# Patient Record
Sex: Male | Born: 1940 | Race: White | Hispanic: No | Marital: Married | Smoking: Former smoker
Health system: Southern US, Community
[De-identification: ages and names within clinical notes are randomized; demographics above are authoritative.]

## PROBLEM LIST (undated history)

## (undated) DIAGNOSIS — K4090 Unilateral inguinal hernia, without obstruction or gangrene, not specified as recurrent: Secondary | ICD-10-CM

## (undated) DIAGNOSIS — E785 Hyperlipidemia, unspecified: Secondary | ICD-10-CM

## (undated) DIAGNOSIS — G4730 Sleep apnea, unspecified: Secondary | ICD-10-CM

## (undated) DIAGNOSIS — Z87442 Personal history of urinary calculi: Secondary | ICD-10-CM

## (undated) DIAGNOSIS — I10 Essential (primary) hypertension: Secondary | ICD-10-CM

## (undated) DIAGNOSIS — I499 Cardiac arrhythmia, unspecified: Secondary | ICD-10-CM

## (undated) DIAGNOSIS — M549 Dorsalgia, unspecified: Secondary | ICD-10-CM

## (undated) DIAGNOSIS — I4891 Unspecified atrial fibrillation: Secondary | ICD-10-CM

## (undated) DIAGNOSIS — K219 Gastro-esophageal reflux disease without esophagitis: Secondary | ICD-10-CM

## (undated) DIAGNOSIS — K649 Unspecified hemorrhoids: Secondary | ICD-10-CM

## (undated) DIAGNOSIS — Z8719 Personal history of other diseases of the digestive system: Secondary | ICD-10-CM

## (undated) DIAGNOSIS — C439 Malignant melanoma of skin, unspecified: Secondary | ICD-10-CM

## (undated) DIAGNOSIS — R109 Unspecified abdominal pain: Secondary | ICD-10-CM

## (undated) DIAGNOSIS — Z8739 Personal history of other diseases of the musculoskeletal system and connective tissue: Secondary | ICD-10-CM

## (undated) HISTORY — DX: Sleep apnea, unspecified: G47.30

## (undated) HISTORY — PX: DIAGNOSTIC LAPAROSCOPY: SUR761

## (undated) HISTORY — PX: COLONOSCOPY: SHX174

## (undated) HISTORY — DX: Dorsalgia, unspecified: M54.9

## (undated) HISTORY — DX: Hyperlipidemia, unspecified: E78.5

## (undated) HISTORY — DX: Unilateral inguinal hernia, without obstruction or gangrene, not specified as recurrent: K40.90

## (undated) HISTORY — DX: Unspecified abdominal pain: R10.9

## (undated) HISTORY — DX: Unspecified hemorrhoids: K64.9

## (undated) HISTORY — DX: Essential (primary) hypertension: I10

## (undated) HISTORY — DX: Gastro-esophageal reflux disease without esophagitis: K21.9

## (undated) HISTORY — DX: Malignant melanoma of skin, unspecified: C43.9

## (undated) HISTORY — PX: HERNIA REPAIR: SHX51

---

## 2000-10-24 ENCOUNTER — Other Ambulatory Visit: Admission: RE | Admit: 2000-10-24 | Discharge: 2000-10-24 | Payer: Self-pay | Admitting: Urology

## 2000-10-31 ENCOUNTER — Encounter: Payer: Self-pay | Admitting: Urology

## 2000-10-31 ENCOUNTER — Ambulatory Visit (HOSPITAL_COMMUNITY): Admission: RE | Admit: 2000-10-31 | Discharge: 2000-10-31 | Payer: Self-pay | Admitting: Urology

## 2003-08-26 ENCOUNTER — Encounter: Admission: RE | Admit: 2003-08-26 | Discharge: 2003-08-26 | Payer: Self-pay | Admitting: Internal Medicine

## 2003-10-19 ENCOUNTER — Ambulatory Visit (HOSPITAL_COMMUNITY): Admission: RE | Admit: 2003-10-19 | Discharge: 2003-10-19 | Payer: Self-pay | Admitting: *Deleted

## 2003-10-19 ENCOUNTER — Encounter (INDEPENDENT_AMBULATORY_CARE_PROVIDER_SITE_OTHER): Payer: Self-pay | Admitting: *Deleted

## 2007-07-07 ENCOUNTER — Encounter: Admission: RE | Admit: 2007-07-07 | Discharge: 2007-07-07 | Payer: Self-pay | Admitting: Internal Medicine

## 2009-11-11 ENCOUNTER — Emergency Department (HOSPITAL_COMMUNITY): Admission: EM | Admit: 2009-11-11 | Discharge: 2009-11-11 | Payer: Self-pay | Admitting: Emergency Medicine

## 2010-08-21 LAB — CBC
HCT: 40.5 % (ref 39.0–52.0)
Hemoglobin: 14 g/dL (ref 13.0–17.0)
MCV: 90.2 fL (ref 78.0–100.0)
RBC: 4.49 MIL/uL (ref 4.22–5.81)
WBC: 26.9 10*3/uL — ABNORMAL HIGH (ref 4.0–10.5)

## 2010-08-21 LAB — DIFFERENTIAL
Basophils Relative: 0 % (ref 0–1)
Eosinophils Relative: 0 % (ref 0–5)
Lymphs Abs: 0.5 10*3/uL — ABNORMAL LOW (ref 0.7–4.0)
Monocytes Relative: 3 % (ref 3–12)
Neutro Abs: 25.6 10*3/uL — ABNORMAL HIGH (ref 1.7–7.7)

## 2010-08-21 LAB — URINALYSIS, ROUTINE W REFLEX MICROSCOPIC
Bilirubin Urine: NEGATIVE
Glucose, UA: NEGATIVE mg/dL
Nitrite: NEGATIVE
Protein, ur: 30 mg/dL — AB
Specific Gravity, Urine: 1.015 (ref 1.005–1.030)
Urobilinogen, UA: 0.2 mg/dL (ref 0.0–1.0)
pH: 5.5 (ref 5.0–8.0)

## 2010-08-21 LAB — URINE MICROSCOPIC-ADD ON

## 2010-08-21 LAB — URINE CULTURE: Colony Count: >=100000

## 2010-08-21 LAB — POCT I-STAT, CHEM 8
BUN: 23 mg/dL (ref 6–23)
Calcium, Ion: 1.16 mmol/L (ref 1.12–1.32)
Chloride: 103 mEq/L (ref 96–112)
Creatinine, Ser: 1.6 mg/dL — ABNORMAL HIGH (ref 0.4–1.5)
TCO2: 27 mmol/L (ref 0–100)

## 2010-10-20 NOTE — Op Note (Signed)
NAME:  Allen Pierce, Allen Pierce                          ACCOUNT NO.:  [id number]   MEDICAL RECORD NO.:  [id number]                   PATIENT TYPE:  AMB   LOCATION:  ENDO                                 FACILITY:  [facility]   PHYSICIAN:  Georgiana Spinner, M.D.                 DATE OF BIRTH:  Jan 15, 1941   DATE OF PROCEDURE:  10/19/2003  DATE OF DISCHARGE:                                 OPERATIVE REPORT   PROCEDURE:  Colonoscopy.   INDICATIONS FOR PROCEDURE:  Rectal bleeding.   ANESTHESIA:  Demerol 20.   PROCEDURE:  With the patient mildly sedated in the left lateral decubitus  position, a rectal exam was performed which revealed only hemorrhoids.  Subsequently, the Olympus videoscopic colonoscope was inserted in the rectum  and passed under direct vision to the cecum identified by the ileocecal  valve and appendiceal orifice, both of which were photographed.  From this  point, the colonoscope was slowly withdrawn taking circumferential views of  the colonic mucosa stopping only in the rectum which appeared normal on  direct and showed hemorrhoids on retroflex view.  The endoscope was  straightened and withdrawn.  The patient's vital signs and pulse oximeter  remained stable.  The patient tolerated the procedure well without apparent  complications.   FINDINGS:  Internal hemorrhoids, otherwise, unremarkable exam.   PLAN:  See endoscopy note for further details.                                               Georgiana Spinner, M.D.    GMO/MEDQ  D:  10/19/2003  T:  10/19/2003  Job:  [id number]

## 2010-10-20 NOTE — Op Note (Signed)
NAME:  NARAYAN, SCULL                          ACCOUNT NO.:  [id number]   MEDICAL RECORD NO.:  [id number]                   PATIENT TYPE:  AMB   LOCATION:  ENDO                                 FACILITY:  [facility]   PHYSICIAN:  Georgiana Spinner, M.D.                 DATE OF BIRTH:  1940/06/18   DATE OF PROCEDURE:  10/19/2003  DATE OF DISCHARGE:                                 OPERATIVE REPORT   PROCEDURE:  Upper endoscopy.   INDICATIONS FOR PROCEDURE:  GERD.   ANESTHESIA:  Demerol 80, Versed 10 mg.   PROCEDURE:  With the patient mildly sedated in the left lateral decubitus  position, the Olympus videoscopic endoscope was inserted in the mouth and  passed under direct vision through the esophagus which appeared normal  except for a questionable area of Barrett's esophagus which was photographed  and biopsied.  We entered into the stomach.  The fundus, body, antrum,  duodenal bulb, and second portion of the duodenum all appeared normal.  From  this point, the endoscope was slowly withdrawn taking circumferential views  of the duodenal mucosa until the endoscope was pulled back into the stomach  and placed in retroflexion to view the stomach from below.  The endoscope  was then straightened and withdrawn taking circumferential views of the  remaining gastric and esophageal mucosa.  The patient's vital signs and  pulse oximeter remained stable.  The patient tolerated the procedure well  without apparent complications.   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   PLAN:  Await biopsy report.  The patient will call me for the results and  follow up with me as an outpatient.  Proceed to colonoscopy.                                               Georgiana Spinner, M.D.    GMO/MEDQ  D:  10/19/2003  T:  10/19/2003  Job:  [id number]

## 2015-02-22 ENCOUNTER — Other Ambulatory Visit: Payer: Self-pay | Admitting: Gastroenterology

## 2015-08-22 DIAGNOSIS — Z125 Encounter for screening for malignant neoplasm of prostate: Secondary | ICD-10-CM | POA: Diagnosis not present

## 2015-08-22 DIAGNOSIS — M5430 Sciatica, unspecified side: Secondary | ICD-10-CM | POA: Diagnosis not present

## 2015-08-22 DIAGNOSIS — R7301 Impaired fasting glucose: Secondary | ICD-10-CM | POA: Diagnosis not present

## 2015-08-22 DIAGNOSIS — I10 Essential (primary) hypertension: Secondary | ICD-10-CM | POA: Diagnosis not present

## 2015-08-22 DIAGNOSIS — Z0000 Encounter for general adult medical examination without abnormal findings: Secondary | ICD-10-CM | POA: Diagnosis not present

## 2015-08-22 DIAGNOSIS — E7800 Pure hypercholesterolemia, unspecified: Secondary | ICD-10-CM | POA: Diagnosis not present

## 2015-08-31 DIAGNOSIS — I10 Essential (primary) hypertension: Secondary | ICD-10-CM | POA: Diagnosis not present

## 2015-08-31 DIAGNOSIS — M4807 Spinal stenosis, lumbosacral region: Secondary | ICD-10-CM | POA: Diagnosis not present

## 2015-08-31 DIAGNOSIS — Z1212 Encounter for screening for malignant neoplasm of rectum: Secondary | ICD-10-CM | POA: Diagnosis not present

## 2015-08-31 DIAGNOSIS — Z0000 Encounter for general adult medical examination without abnormal findings: Secondary | ICD-10-CM | POA: Diagnosis not present

## 2015-08-31 DIAGNOSIS — E782 Mixed hyperlipidemia: Secondary | ICD-10-CM | POA: Diagnosis not present

## 2015-09-26 DIAGNOSIS — L218 Other seborrheic dermatitis: Secondary | ICD-10-CM | POA: Diagnosis not present

## 2015-09-26 DIAGNOSIS — X32XXXA Exposure to sunlight, initial encounter: Secondary | ICD-10-CM | POA: Diagnosis not present

## 2015-09-26 DIAGNOSIS — D225 Melanocytic nevi of trunk: Secondary | ICD-10-CM | POA: Diagnosis not present

## 2015-09-26 DIAGNOSIS — L570 Actinic keratosis: Secondary | ICD-10-CM | POA: Diagnosis not present

## 2015-09-27 DIAGNOSIS — M4800 Spinal stenosis, site unspecified: Secondary | ICD-10-CM | POA: Diagnosis not present

## 2015-09-27 DIAGNOSIS — M549 Dorsalgia, unspecified: Secondary | ICD-10-CM | POA: Diagnosis not present

## 2015-09-27 DIAGNOSIS — G8929 Other chronic pain: Secondary | ICD-10-CM | POA: Diagnosis not present

## 2015-10-14 DIAGNOSIS — R58 Hemorrhage, not elsewhere classified: Secondary | ICD-10-CM | POA: Diagnosis not present

## 2015-11-16 DIAGNOSIS — R319 Hematuria, unspecified: Secondary | ICD-10-CM | POA: Diagnosis not present

## 2015-11-16 DIAGNOSIS — R05 Cough: Secondary | ICD-10-CM | POA: Diagnosis not present

## 2015-11-16 DIAGNOSIS — E319 Polyglandular dysfunction, unspecified: Secondary | ICD-10-CM | POA: Diagnosis not present

## 2015-11-24 DIAGNOSIS — R319 Hematuria, unspecified: Secondary | ICD-10-CM | POA: Diagnosis not present

## 2016-01-03 DIAGNOSIS — G8929 Other chronic pain: Secondary | ICD-10-CM | POA: Diagnosis not present

## 2016-01-03 DIAGNOSIS — M4800 Spinal stenosis, site unspecified: Secondary | ICD-10-CM | POA: Diagnosis not present

## 2016-02-29 DIAGNOSIS — R7303 Prediabetes: Secondary | ICD-10-CM | POA: Diagnosis not present

## 2016-02-29 DIAGNOSIS — Z23 Encounter for immunization: Secondary | ICD-10-CM | POA: Diagnosis not present

## 2016-02-29 DIAGNOSIS — Z6831 Body mass index (BMI) 31.0-31.9, adult: Secondary | ICD-10-CM | POA: Diagnosis not present

## 2016-05-23 DIAGNOSIS — R109 Unspecified abdominal pain: Secondary | ICD-10-CM | POA: Diagnosis not present

## 2016-05-24 ENCOUNTER — Other Ambulatory Visit: Payer: Self-pay | Admitting: Internal Medicine

## 2016-05-24 ENCOUNTER — Other Ambulatory Visit: Payer: Self-pay

## 2016-05-24 DIAGNOSIS — R1084 Generalized abdominal pain: Secondary | ICD-10-CM

## 2016-05-25 ENCOUNTER — Ambulatory Visit
Admission: RE | Admit: 2016-05-25 | Discharge: 2016-05-25 | Disposition: A | Discharge status: Home / Self Care | Payer: PPO | Source: Ambulatory Visit | Attending: Internal Medicine | Admitting: Internal Medicine

## 2016-05-25 DIAGNOSIS — R1084 Generalized abdominal pain: Secondary | ICD-10-CM | POA: Diagnosis not present

## 2016-05-25 MED ORDER — IOPAMIDOL (ISOVUE-300) INJECTION 61%
100.0000 mL | Freq: Once | INTRAVENOUS | Status: AC | PRN
  Administered 2016-05-25: 100 mL via INTRAVENOUS

## 2016-06-04 HISTORY — PX: DIAGNOSTIC LAPAROSCOPY: SUR761

## 2016-06-07 ENCOUNTER — Ambulatory Visit: Payer: Self-pay | Admitting: Surgery

## 2016-06-07 DIAGNOSIS — R109 Unspecified abdominal pain: Secondary | ICD-10-CM | POA: Diagnosis not present

## 2016-06-07 DIAGNOSIS — K4090 Unilateral inguinal hernia, without obstruction or gangrene, not specified as recurrent: Secondary | ICD-10-CM | POA: Diagnosis not present

## 2016-06-07 NOTE — H&P (Signed)
Allen Pierce 06/07/2016 11:06 AM Location: [facility] Patient #: [id number] DOB: 1941-05-26 Married / Language: English / Race: White Male  History of Present Illness (Allen Pierce A. Allen Heller Allen Pierce; 06/07/2016 11:52 AM) Patient words: This is a very nice 76 year old man who is referred for upper abdominal pain. He began experiencing this about a month ago, and it first happened when he was getting up out of bed in the morning. The pain is across his upper abdomen and subcostal margins and dissipates within minutes of sitting down. The other times it is happens it has been when he is going from a seated or supine to a standing position and again resolves once he sits back down. There is no associated nausea or emesis. There is no associated change in appetite or bowel function. There is no association with any particular food or time of day. On one occasion the pain did radiate to his back and along his right flank consistent with his prior experiences of kidney stones and this resolves after taking an additional oxycodone. He has had the pain episodes as often as 3-4 times a week but for example last week he had no episodes of pain. His primary care physician had him complete a abdominal x-ray as well as a CT scan. The CT scan revealed a 3.5 cm calcified soft tissue mass adjacent to distal small bowel mesentery; this was present on a CT scan that was performed in 2005 and has not changed at all. The CT also confirmed a large right inguinal hernia with some loops of small bowel and cecum within it. There is no pathology to explain his upper abdominal pain. He denies chest pain or shortness of breath, other than during episodes of pain where he feels like he can't hardly take a deep breath. He also has a right inguinal hernia which is known about for about 5 months. It has started to cause him some mild discomfort which is improved by change in position. He is interested in having this  repaired as well.  The patient is a 76 year old male.   Past Surgical History Allen Pierce, Allen Pierce; 06/07/2016 11:06 AM) No pertinent past surgical history  Diagnostic Studies History Allen Pierce, Allen Pierce; 06/07/2016 11:06 AM) Colonoscopy within last year  Allergies Allen Pierce, Allen Pierce; 06/07/2016 11:07 AM) No Known Drug Allergies 06/07/2016  Medication History (Allen Pierce, Allen Pierce; 06/07/2016 11:09 AM) Metoprolol Tartrate (50MG  Tablet, Oral) Active. AmLODIPine Besylate (5MG  Tablet, Oral) Active. Rosuvastatin Calcium (20MG  Tablet, Oral) Active. Valsartan-Hydrochlorothiazide (320-25MG  Tablet, Oral) Active. Aspirin (81MG  Tablet, Oral) Active. Allopurinol (300MG  Tablet, Oral) Active. Oxycodone-Acetaminophen (5-325MG  Tablet, Oral) Active. Klor-Con 10 (10MEQ Tablet ER, Oral) Active. Niaspan (500MG  Tablet ER, Oral) Active. Medications Reconciled  Social History Allen Pierce, Allen Pierce; 06/07/2016 11:06 AM) Caffeine use Carbonated beverages, Coffee, Tea. No alcohol use No drug use Tobacco use Former smoker.  Family History Allen Pierce, [place]; 06/07/2016 11:06 AM) Cancer Brother. Cerebrovascular Accident Father. Colon Polyps Brother. Diabetes Mellitus Brother. Hypertension Brother, Father, Mother. Melanoma Brother.  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     Review of Systems (Allen Pierce; 06/07/2016 11:06 AM) General Not Present- Appetite Loss, Chills, Fatigue, Fever, Night Sweats, Weight Gain and Weight Loss. Skin Not Present- Change in Wart/Mole, Dryness, Hives, Jaundice, New Lesions, Non-Healing Wounds, Rash and Ulcer. HEENT  Present- Ringing in the Ears, Seasonal Allergies and Wears glasses/contact lenses. Not Present-  Earache, Hearing Loss, Hoarseness, Nose Bleed, Oral Ulcers, Sinus Pain, Sore Throat, Visual Disturbances and Yellow Eyes. Respiratory Present- Snoring. Not Present- Bloody sputum, Chronic Cough, Difficulty Breathing and Wheezing. Breast Not Present- Breast Mass, Breast Pain, Nipple Discharge and Skin Changes. Cardiovascular Present- Swelling of Extremities. Not Present- Chest Pain, Difficulty Breathing Lying Down, Leg Cramps, Palpitations, Rapid Heart Rate and Shortness of Breath. Gastrointestinal Present- Abdominal Pain, Change in Bowel Habits, Hemorrhoids and Indigestion. Not Present- Bloating, Bloody Stool, Chronic diarrhea, Constipation, Difficulty Swallowing, Excessive gas, Gets full quickly at meals, Nausea, Rectal Pain and Vomiting. Male Genitourinary Present- Nocturia and Urgency. Not Present- Blood in Urine, Change in Urinary Stream, Frequency, Impotence, Painful Urination and Urine Leakage. Musculoskeletal Present- Back Pain. Not Present- Joint Pain, Joint Stiffness, Muscle Pain, Muscle Weakness and Swelling of Extremities. Neurological Present- Decreased Memory. Not Present- Fainting, Headaches, Numbness, Seizures, Tingling, Tremor, Trouble walking and Weakness. Psychiatric Not Present- Anxiety, Bipolar, Change in Sleep Pattern, Depression, Fearful and Frequent crying. Endocrine Not Present- Cold Intolerance, Excessive Hunger, Hair Changes, Heat Intolerance, Hot flashes and New Diabetes. Hematology Not Present- Blood Thinners, Easy Bruising, Excessive bleeding, Gland problems, HIV and Persistent Infections.  Vitals (Allen Pierce; 06/07/2016 11:07 AM) 06/07/2016 11:06 AM Weight: 237 lb Height: 73in Body Surface Area: 2.31 m Body Mass Index: 31.27 kg/m  Temp.: 1F  Pulse: 54 (Regular)  P.OX: 98% (Room air) BP: 152/92 (Sitting, Left Arm, Standard)      Physical Exam (Allen Pierce A. Allen Heller Allen Pierce; 06/07/2016 11:53 AM)  The physical exam findings are as follows: Note:He is alert  and oriented, no distress Unlabored respirations. The abdomen is soft nontender nondistended. No mass or organomegaly.He has never had surgery there are no scars. There is a large right inguinal hernia which is partially reducible.    Assessment & Plan (Mayzee Reichenbach A. Allen Heller Allen Pierce; 06/07/2016 11:58 AM)  ABDOMINAL PAIN (R10.9) Story: This pain which is quite responsive to positional changes is atypical for intra-abdominal pathology. He does not have signs or symptoms that would make me concerned for gallbladder disease, and although he does have an abnormal finding on his CT scan, it has been present for over a decade and his symptoms do not sound like intermittent obstruction or ischemia, in fact they sound much more musculoskeletal to me. Although it would also be atypical, I would like him to see a cardiologist and get a stress test to rule out atypical angina given the fact that the pain is relieved by resting in association with his age and history of hypertension.  RIGHT INGUINAL HERNIA (K40.90) Story: This is a large and reduces when the patient is supine. I recommended that he have it repaired based on the fact that there is now bowel within the hernia. We discussed that the best option would probably be a laparoscopic repair, this would've for the opportunity to look at the other side as well as to survey the abdomen and further evaluate this calcified mesenteric mass. If this is easily found and removable without significant sequelae, I would consider removing while there for the hernia surgery. We discussed using mesh, the risks of surgery including pain, scarring, injury to adjacent structures including bowel and nerve tissue and blood vessels, discussed the low risk of recurrence. We also discussed signs and symptoms of incarceration and strangulation which should provoke him to go to the emergency department. He understands and is agreeable to proceeding with surgery.  I spent 45 minutes with  this patient today no less than 30 minutes of which was spent on counseling and education.

## 2016-06-21 ENCOUNTER — Ambulatory Visit: Payer: PPO | Admitting: Cardiology

## 2016-07-04 ENCOUNTER — Encounter: Payer: Self-pay | Admitting: *Deleted

## 2016-07-04 NOTE — Patient Instructions (Signed)
Allen Pierce  07/04/2016   Your procedure is scheduled on: 07/11/2016    Report to [facility]  Entrance take [place]  elevators to 3rd floor to  [place] at    [place] AM.  Call this number if you have problems the morning of surgery [phone]   Remember: ONLY 1 PERSON MAY GO WITH YOU TO SHORT STAY TO GET  READY MORNING OF [place].  Do not eat food or drink liquids :After Midnight.     Take these medicines the morning of surgery with A SIP OF WATER: Allopurinol, amlodipine ( NOrvasc), metoprolol ( Lopressor), Zantac if needed                                 You may not have any metal on your body including hair pins and              piercings  Do not wear jewelry,  lotions, powders or perfumes, deodorant                    Men may shave face and neck.   Do not bring valuables to the hospital. [place].  Contacts, dentures or bridgework may not be worn into surgery.      Patients discharged the day of surgery will not be allowed to drive home.  Name and phone number of your driver:                Please read over the following fact sheets you were given: _____________________________________________________________________             [facility] - Preparing for Surgery Before surgery, you can play an important role.  Because skin is not sterile, your skin needs to be as free of germs as possible.  You can reduce the number of germs on your skin by washing with CHG (chlorahexidine gluconate) soap before surgery.  CHG is an antiseptic cleaner which kills germs and bonds with the skin to continue killing germs even after washing. Please DO NOT use if you have an allergy to CHG or antibacterial soaps.  If your skin becomes reddened/irritated stop using the CHG and inform your nurse when you arrive at Short Stay. Do not shave (including legs and underarms) for at least 48 hours prior to the  first CHG shower.  You may shave your face/neck. Please follow these instructions carefully:  1.  Shower with CHG Soap the night before surgery and the  morning of Surgery.  2.  If you choose to wash your hair, wash your hair first as usual with your  normal  shampoo.  3.  After you shampoo, rinse your hair and body thoroughly to remove the  shampoo.                           4.  Use CHG as you would any other liquid soap.  You can apply chg directly  to the skin and wash                       Gently with a scrungie or clean washcloth.  5.  Apply  the CHG Soap to your body ONLY FROM THE NECK DOWN.   Do not use on face/ open                           Wound or open sores. Avoid contact with eyes, ears mouth and genitals (private parts).                       Wash face,  Genitals (private parts) with your normal soap.             6.  Wash thoroughly, paying special attention to the area where your surgery  will be performed.  7.  Thoroughly rinse your body with warm water from the neck down.  8.  DO NOT shower/wash with your normal soap after using and rinsing off  the CHG Soap.                9.  Pat yourself dry with a clean towel.            10.  Wear clean pajamas.            11.  Place clean sheets on your bed the night of your first shower and do not  sleep with pets. Day of Surgery : Do not apply any lotions/deodorants the morning of surgery.  Please wear clean clothes to the hospital/surgery center.  FAILURE TO FOLLOW THESE INSTRUCTIONS MAY RESULT IN THE CANCELLATION OF YOUR SURGERY PATIENT SIGNATURE_________________________________  NURSE SIGNATURE__________________________________  ________________________________________________________________________

## 2016-07-06 ENCOUNTER — Encounter (HOSPITAL_COMMUNITY)
Admission: RE | Admit: 2016-07-06 | Discharge: 2016-07-06 | Disposition: A | Discharge status: Home / Self Care | Payer: PPO | Source: Ambulatory Visit | Attending: Surgery | Admitting: Surgery

## 2016-07-06 ENCOUNTER — Encounter (HOSPITAL_COMMUNITY): Payer: Self-pay

## 2016-07-06 ENCOUNTER — Ambulatory Visit (HOSPITAL_COMMUNITY)
Admission: RE | Admit: 2016-07-06 | Discharge: 2016-07-06 | Disposition: A | Discharge status: Home / Self Care | Payer: PPO | Source: Ambulatory Visit | Attending: Surgery | Admitting: Surgery

## 2016-07-06 ENCOUNTER — Encounter (INDEPENDENT_AMBULATORY_CARE_PROVIDER_SITE_OTHER): Payer: Self-pay

## 2016-07-06 DIAGNOSIS — Z01818 Encounter for other preprocedural examination: Secondary | ICD-10-CM | POA: Insufficient documentation

## 2016-07-06 DIAGNOSIS — R001 Bradycardia, unspecified: Secondary | ICD-10-CM | POA: Insufficient documentation

## 2016-07-06 DIAGNOSIS — Z01810 Encounter for preprocedural cardiovascular examination: Secondary | ICD-10-CM

## 2016-07-06 DIAGNOSIS — I440 Atrioventricular block, first degree: Secondary | ICD-10-CM | POA: Insufficient documentation

## 2016-07-06 DIAGNOSIS — K4090 Unilateral inguinal hernia, without obstruction or gangrene, not specified as recurrent: Secondary | ICD-10-CM | POA: Insufficient documentation

## 2016-07-06 HISTORY — DX: Personal history of urinary calculi: Z87.442

## 2016-07-06 LAB — COMPREHENSIVE METABOLIC PANEL
ALK PHOS: 51 U/L (ref 38–126)
ALT: 14 U/L — AB (ref 17–63)
AST: 18 U/L (ref 15–41)
Albumin: 4.1 g/dL (ref 3.5–5.0)
Anion gap: 7 (ref 5–15)
BILIRUBIN TOTAL: 0.8 mg/dL (ref 0.3–1.2)
BUN: 13 mg/dL (ref 6–20)
CO2: 31 mmol/L (ref 22–32)
CREATININE: 1.15 mg/dL (ref 0.61–1.24)
Calcium: 8.7 mg/dL — ABNORMAL LOW (ref 8.9–10.3)
Chloride: 102 mmol/L (ref 101–111)
GFR calc Af Amer: >60 mL/min (ref >60)
GLUCOSE: 87 mg/dL (ref 65–99)
Potassium: 3.3 mmol/L — ABNORMAL LOW (ref 3.5–5.1)
Sodium: 140 mmol/L (ref 135–145)
TOTAL PROTEIN: 7 g/dL (ref 6.5–8.1)

## 2016-07-06 LAB — CBC WITH DIFFERENTIAL/PLATELET
BASOS ABS: 0 10*3/uL (ref 0.0–0.1)
Basophils Relative: 1 %
Eosinophils Absolute: 0.3 10*3/uL (ref 0.0–0.7)
Eosinophils Relative: 3 %
HEMATOCRIT: 40 % (ref 39.0–52.0)
HEMOGLOBIN: 13.1 g/dL (ref 13.0–17.0)
LYMPHS PCT: 31 %
Lymphs Abs: 2.6 10*3/uL (ref 0.7–4.0)
MCH: 28.4 pg (ref 26.0–34.0)
MCHC: 32.8 g/dL (ref 30.0–36.0)
MCV: 86.6 fL (ref 78.0–100.0)
MONO ABS: 0.8 10*3/uL (ref 0.1–1.0)
Monocytes Relative: 10 %
NEUTROS ABS: 4.7 10*3/uL (ref 1.7–7.7)
NEUTROS PCT: 55 %
Platelets: 254 10*3/uL (ref 150–400)
RBC: 4.62 MIL/uL (ref 4.22–5.81)
RDW: 13.5 % (ref 11.5–15.5)
WBC: 8.3 10*3/uL (ref 4.0–10.5)

## 2016-07-06 LAB — PROTIME-INR
INR: 1.08
PROTHROMBIN TIME: 14 s (ref 11.4–15.2)

## 2016-07-06 LAB — APTT: APTT: 39 s — AB (ref 24–36)

## 2016-07-09 ENCOUNTER — Encounter: Payer: Self-pay | Admitting: Nurse Practitioner

## 2016-07-09 ENCOUNTER — Ambulatory Visit (INDEPENDENT_AMBULATORY_CARE_PROVIDER_SITE_OTHER): Payer: PPO | Admitting: Nurse Practitioner

## 2016-07-09 ENCOUNTER — Encounter (INDEPENDENT_AMBULATORY_CARE_PROVIDER_SITE_OTHER): Payer: Self-pay

## 2016-07-09 VITALS — BP 142/90 | HR 67 | Ht 73.0 in | Wt 237.8 lb

## 2016-07-09 DIAGNOSIS — R0789 Other chest pain: Secondary | ICD-10-CM | POA: Diagnosis not present

## 2016-07-09 DIAGNOSIS — Z01818 Encounter for other preprocedural examination: Secondary | ICD-10-CM | POA: Diagnosis not present

## 2016-07-09 DIAGNOSIS — R011 Cardiac murmur, unspecified: Secondary | ICD-10-CM | POA: Diagnosis not present

## 2016-07-09 NOTE — Progress Notes (Signed)
Final EKG done 07/06/16-epic

## 2016-07-09 NOTE — Patient Instructions (Addendum)
We will be checking the following labs today - NONE   Medication Instructions:    Continue with your current medicines.     Testing/Procedures To Be Arranged:  Stress Myoview  Echocardiogram  Follow-Up:   Will see how your studies turn out.   Would postpone your hernia surgery until we get your cardiac testing completed.      Other Special Instructions:   N/A    If you need a refill on your cardiac medications before your next appointment, please call your pharmacy.   Call the [place] office at [phone] if you have any questions, problems or concerns.

## 2016-07-09 NOTE — Progress Notes (Signed)
CARDIOLOGY OFFICE NOTE  Date:  07/09/2016    Allen Pierce Date of Birth: [birth year] Medical Record [id number]  PCP:  Horatio Pel, MD  Cardiologist:  End (NEW)  Chief Complaint  Patient presents with  . Pre-op Exam    New patient visit - seen for Dr. Saunders Revel (NEW)    History of Present Illness: Allen Pierce is a 76 y.o. male who presents today for a cardiac evaluation. Seen for Dr. Saunders Revel (DOD).  He has a history of HLD, GERD, HTN and prior OSA. No actual known CAD.   Referred here from General Surgery - has had positional abdominal pain - abnormal finding on CT scan that has been present for over a decade. Symptoms seemed more musculoskeletal to Dr. Kae Heller. Has asked for cardiac evaluation with possible stress testing due to the fact that his pain is relieved with rest and his risk factors of age and HTN.   Comes in today. Here alone. He notes that for the past 4 to 5 months he has had a pain really located in the upper abdomen. It is off and on. Described as sharp. Starts when he goes to stand. Always relieved with rest. Associated with shortness of breath. Can last for a few minutes or up to several hours. FH + for CAD. Noted atherosclerosis on recent CT exam. Has HTN and HLD as well. He tries to be active - walks about 20 minutes a day - this does not cause any symptoms. Still working. No palpitations. Not dizzy or lightheaded. Dr. Shelia Media wanted to make sure there was not a cardiac etiology.   Past Medical History:  Diagnosis Date  . AP (abdominal pain)   . Back pain   . GERD (gastroesophageal reflux disease)   . Hemorrhoids   . History of kidney stones   . HLD (hyperlipidemia)   . HTN (hypertension)   . Melanoma ([place])   . Right inguinal hernia   . Sleep apnea    discontinued 3-4 years ago     Past Surgical History:  Procedure Laterality Date  . COLONOSCOPY     x 2 along with endoscopy each time   . NO PAST SURGERIES       Medications: Current  Outpatient Prescriptions  Medication Sig Dispense Refill  . allopurinol (ZYLOPRIM) 300 MG tablet Take 300 mg by mouth daily.    Marland Kitchen amLODipine (NORVASC) 5 MG tablet Take 5 mg by mouth daily.    Marland Kitchen aspirin EC 81 MG tablet Take 81 mg by mouth every evening.    Mariane Baumgarten Calcium (STOOL SOFTENER PO) Take 1 tablet by mouth 2 (two) times daily.    . metoprolol (LOPRESSOR) 50 MG tablet Take 50 mg by mouth 2 (two) times daily.    . mineral oil liquid Take 10 mLs by mouth at bedtime.    . niacin (NIASPAN) 1000 MG CR tablet Take 500 mg by mouth every evening.    Marland Kitchen oxyCODONE-acetaminophen (PERCOCET/ROXICET) 5-325 MG tablet Take 1 tablet by mouth every evening.    . RaNITidine HCl (ZANTAC PO) Take 1 tablet by mouth daily as needed (heartburn).    . rosuvastatin (CRESTOR) 10 MG tablet Take 20 mg by mouth every other day.      No current facility-administered medications for this visit.     Allergies: No Known Allergies  Social History: The patient  reports that he has quit smoking. He has never used smokeless tobacco. He reports that he does not  drink alcohol or use drugs.   Family History: The patient's family history includes CVA in his father; Colon polyps in his brother; Diabetes in his brother; Hypertension in his brother, father, and mother; Melanoma in his brother.   Review of Systems: Please see the history of present illness.   Otherwise, the review of systems is positive for none.   All other systems are reviewed and negative.   Physical Exam: VS:  BP (!) 142/90   Pulse 67   Ht 6\' 1"  (1.854 m)   Wt 237 lb 12.8 oz (107.9 kg)   SpO2 99% Comment: at rest  BMI 31.37 kg/m  .  BMI Body mass index is 31.37 kg/m.  Wt Readings from Last 3 Encounters:  07/09/16 237 lb 12.8 oz (107.9 kg)  07/06/16 232 lb (105.2 kg)    General: Pleasant. Well developed, well nourished and in no acute distress.   HEENT: Normal.  Neck: Supple, no JVD, carotid bruits, or masses noted.  Cardiac: Regular rate  and rhythm. Soft outflow murmur. No edema.  Respiratory:  Lungs are clear to auscultation bilaterally with normal work of breathing.  GI: Soft and nontender.  MS: No deformity or atrophy. Gait and ROM intact.  Skin: Warm and dry. Color is normal.  Neuro:  Strength and sensation are intact and no gross focal deficits noted.  Psych: Alert, appropriate and with normal affect.   LABORATORY DATA:  EKG:  EKG is not ordered today. EKG from last Friday reviewed - this showed NSR.   Lab Results  Component Value Date   WBC 8.3 07/06/2016   HGB 13.1 07/06/2016   HCT 40.0 07/06/2016   PLT 254 07/06/2016   GLUCOSE 87 07/06/2016   ALT 14 (L) 07/06/2016   AST 18 07/06/2016   NA 140 07/06/2016   K 3.3 (L) 07/06/2016   CL 102 07/06/2016   CREATININE 1.15 07/06/2016   BUN 13 07/06/2016   CO2 31 07/06/2016   INR 1.08 07/06/2016    BNP (last 3 results) No results for input(s): BNP in the last 8760 hours.  ProBNP (last 3 results) No results for input(s): PROBNP in the last 8760 hours.   Other Studies Reviewed Today:  CT ABDOMEN IMPRESSION: 1. Large right inguinal hernia containing distal small bowel and tip of the cecum with associated inflammatory haziness and stranding. The latter finding raises suspicion for incarceration. Ischemia is not excluded. Appendix is questionably visualized. Appendicitis cannot be excluded. These results will be called to the ordering clinician or representative by the Radiologist Assistant, and communication documented in the PACS or zVision Dashboard. 2. 12 mm stone in the distal right ureter without hydronephrosis. 3. Enlarged prostate. 4.  Aortic atherosclerosis (ICD10-170.0).  Other: A peripherally calcified soft tissue lesion in the small bowel mesentery measures 3.2 x 3.2 cm, similar to 08/26/2003, indicative of a benign lesion. Inflammatory changes in the anterior right anatomic pelvis are again noted, associated with distal small bowel. No  free fluid. Mesenteries and peritoneum are otherwise unremarkable.  Electronically Signed   By: Lorin Picket M.D.   On: 05/25/2016 15:13  Assessment/Plan: 1. Chest/abdominal pain - very atypical - but risk factors include HTN, age, gender, FH and noted atherosclerosis on CT scan - needs stress testing.   2. HTN - unclear if controlled. Would monitor for now.   3. HLD - on statin therapy - labs followed by Dr. Shelia Media.   4. Murmur - needs echo.   5. Pre op clearance - would  favor proceeding on with the above mentioned tests and if ok, he may proceed. Unfortunately, his surgery was for this Wednesday - I am not able to get these studies completed by then - he will need to reschedule.   Current medicines are reviewed with the patient today.  The patient does not have concerns regarding medicines other than what has been noted above.  The following changes have been made:  See above.  Labs/ tests ordered today include:    Orders Placed This Encounter  Procedures  . Myocardial Perfusion Imaging  . ECHOCARDIOGRAM COMPLETE     Disposition:   Further disposition pending.   Patient is agreeable to this plan and will call if any problems develop in the interim.   SignedTruitt Merle, NP  07/09/2016 3:27 PM  [facility] [address] [place], [place]  [address] Phone: [phone] Fax: [phone]

## 2016-07-10 ENCOUNTER — Telehealth (HOSPITAL_COMMUNITY): Payer: Self-pay | Admitting: *Deleted

## 2016-07-10 NOTE — Telephone Encounter (Signed)
Patient given detailed instructions per Myocardial Perfusion Study Information Sheet for the test on 07/13/16 at 0815. Patient notified to arrive 15 minutes early and that it is imperative to arrive on time for appointment to keep from having the test rescheduled.  If you need to cancel or reschedule your appointment, please call the office within 24 hours of your appointment. Failure to do so may result in a cancellation of your appointment, and a $50 no show fee. Patient verbalized understanding.Luverne Zerkle, [place]

## 2016-07-11 ENCOUNTER — Telehealth (HOSPITAL_COMMUNITY): Payer: Self-pay | Admitting: *Deleted

## 2016-07-11 NOTE — Telephone Encounter (Signed)
Patient given detailed instructions per Myocardial Perfusion Study Information Sheet for the test on 07/13/16. Patient notified to arrive 15 minutes early and that it is imperative to arrive on time for appointment to keep from having the test rescheduled.  If you need to cancel or reschedule your appointment, please call the office within 24 hours of your appointment. Failure to do so may result in a cancellation of your appointment, and a $50 no show fee. Patient verbalized understanding. Kirstie Peri

## 2016-07-13 ENCOUNTER — Ambulatory Visit (HOSPITAL_COMMUNITY): Payer: PPO | Attending: Internal Medicine

## 2016-07-13 DIAGNOSIS — R011 Cardiac murmur, unspecified: Secondary | ICD-10-CM | POA: Diagnosis not present

## 2016-07-13 DIAGNOSIS — I2510 Atherosclerotic heart disease of native coronary artery without angina pectoris: Secondary | ICD-10-CM | POA: Insufficient documentation

## 2016-07-13 DIAGNOSIS — Z01818 Encounter for other preprocedural examination: Secondary | ICD-10-CM | POA: Diagnosis not present

## 2016-07-13 DIAGNOSIS — R0602 Shortness of breath: Secondary | ICD-10-CM | POA: Insufficient documentation

## 2016-07-13 DIAGNOSIS — I10 Essential (primary) hypertension: Secondary | ICD-10-CM | POA: Diagnosis not present

## 2016-07-13 DIAGNOSIS — R0789 Other chest pain: Secondary | ICD-10-CM | POA: Diagnosis not present

## 2016-07-13 LAB — MYOCARDIAL PERFUSION IMAGING
LV dias vol: 125 mL (ref 62–150)
LV sys vol: 54 mL
Peak HR: 162 {beats}/min
RATE: 0.31
Rest HR: 62 {beats}/min
SDS: 1
SRS: 0
SSS: 1
TID: 1.12

## 2016-07-13 MED ORDER — TECHNETIUM TC 99M TETROFOSMIN IV KIT
10.4000 | PACK | Freq: Once | INTRAVENOUS | Status: AC | PRN
  Administered 2016-07-13: 10.4 via INTRAVENOUS
  Filled 2016-07-13: qty 11

## 2016-07-13 MED ORDER — TECHNETIUM TC 99M TETROFOSMIN IV KIT
31.9000 | PACK | Freq: Once | INTRAVENOUS | Status: AC | PRN
  Administered 2016-07-13: 31.9 via INTRAVENOUS
  Filled 2016-07-13: qty 32

## 2016-07-20 ENCOUNTER — Other Ambulatory Visit (HOSPITAL_COMMUNITY): Payer: PPO

## 2016-07-30 NOTE — Progress Notes (Signed)
Please place orders in EPIC as patient is being scheduled for a pre-op appointment! Patient's surgery was cancelled previously  and now rescheduled! Thank you!

## 2016-07-31 NOTE — Progress Notes (Signed)
Please place orders in EPIC as patient has a Pre-op appointment for 08/14/2016 at 2 pm and all her orders are not in as she was rescheduled from previous surgery date! Thank you!

## 2016-08-10 ENCOUNTER — Other Ambulatory Visit: Payer: Self-pay | Admitting: Surgery

## 2016-08-10 DIAGNOSIS — K4090 Unilateral inguinal hernia, without obstruction or gangrene, not specified as recurrent: Secondary | ICD-10-CM

## 2016-08-13 NOTE — Patient Instructions (Signed)
Allen Pierce  08/13/2016   Your procedure is scheduled on: 08/17/16  Report to [facility] Main  Entrance take [place]  elevators to 3rd floor to  [name] at     [phone].  Call this number if you have problems the morning of surgery [phone]   Remember: ONLY 1 PERSON MAY GO WITH YOU TO SHORT STAY TO GET  READY MORNING OF YOUR SURGERY.  Do not eat food or drink liquids :After Midnight.     Take these medicines the morning of surgery with A SIP OF WATER: allopurinol, amlodipine, metoprolol, oxycodone if needed, zantac                                You may not have any metal on your body including hair pins and              piercings  Do not wear jewelry, make-up, lotions, powders or perfumes, deodorant                      Men may shave face and neck.   Do not bring valuables to the hospital. [place].  Contacts, dentures or bridgework may not be worn into surgery.  Leave suitcase in the car. After surgery it may be brought to your room.    _____________             [facility] - Preparing for Surgery Before surgery, you can play an important role.  Because skin is not sterile, your skin needs to be as free of germs as possible.  You can reduce the number of germs on your skin by washing with CHG (chlorahexidine gluconate) soap before surgery.  CHG is an antiseptic cleaner which kills germs and bonds with the skin to continue killing germs even after washing. Please DO NOT use if you have an allergy to CHG or antibacterial soaps.  If your skin becomes reddened/irritated stop using the CHG and inform your nurse when you arrive at Short Stay. Do not shave (including legs and underarms) for at least 48 hours prior to the first CHG shower.  You may shave your face/neck. Please follow these instructions carefully:  1.  Shower with CHG Soap the night before surgery and the  morning of Surgery.  2.  If  you choose to wash your hair, wash your hair first as usual with your  normal  shampoo.  3.  After you shampoo, rinse your hair and body thoroughly to remove the  shampoo.                           4.  Use CHG as you would any other liquid soap.  You can apply chg directly  to the skin and wash                       Gently with a scrungie or clean washcloth.  5.  Apply the CHG Soap to your body ONLY FROM THE NECK DOWN.   Do not use on face/ open  Wound or open sores. Avoid contact with eyes, ears mouth and genitals (private parts).                       Wash face,  Genitals (private parts) with your normal soap.             6.  Wash thoroughly, paying special attention to the area where your surgery  will be performed.  7.  Thoroughly rinse your body with warm water from the neck down.  8.  DO NOT shower/wash with your normal soap after using and rinsing off  the CHG Soap.                9.  Pat yourself dry with a clean towel.            10.  Wear clean pajamas.            11.  Place clean sheets on your bed the night of your first shower and do not  sleep with pets. Day of Surgery : Do not apply any lotions/deodorants the morning of surgery.  Please wear clean clothes to the hospital/surgery center.  FAILURE TO FOLLOW THESE INSTRUCTIONS MAY RESULT IN THE CANCELLATION OF YOUR SURGERY PATIENT SIGNATURE_________________________________  NURSE SIGNATURE__________________________________  ________________________________________________________________________

## 2016-08-13 NOTE — Progress Notes (Signed)
Need orders in epic .  Preop on 08/14/16.  Surgery on 08/17/16.  Thank You.

## 2016-08-14 ENCOUNTER — Encounter (HOSPITAL_COMMUNITY): Payer: Self-pay

## 2016-08-14 ENCOUNTER — Encounter (HOSPITAL_COMMUNITY)
Admission: RE | Admit: 2016-08-14 | Discharge: 2016-08-14 | Disposition: A | Discharge status: Home / Self Care | Payer: PPO | Source: Ambulatory Visit | Attending: Surgery | Admitting: Surgery

## 2016-08-14 DIAGNOSIS — Z7982 Long term (current) use of aspirin: Secondary | ICD-10-CM | POA: Diagnosis not present

## 2016-08-14 DIAGNOSIS — Z85820 Personal history of malignant melanoma of skin: Secondary | ICD-10-CM | POA: Diagnosis not present

## 2016-08-14 DIAGNOSIS — Z01818 Encounter for other preprocedural examination: Secondary | ICD-10-CM

## 2016-08-14 DIAGNOSIS — Z87891 Personal history of nicotine dependence: Secondary | ICD-10-CM | POA: Diagnosis not present

## 2016-08-14 DIAGNOSIS — E7800 Pure hypercholesterolemia, unspecified: Secondary | ICD-10-CM | POA: Diagnosis not present

## 2016-08-14 DIAGNOSIS — I10 Essential (primary) hypertension: Secondary | ICD-10-CM | POA: Diagnosis not present

## 2016-08-14 DIAGNOSIS — K4090 Unilateral inguinal hernia, without obstruction or gangrene, not specified as recurrent: Secondary | ICD-10-CM | POA: Insufficient documentation

## 2016-08-14 DIAGNOSIS — G4730 Sleep apnea, unspecified: Secondary | ICD-10-CM | POA: Diagnosis not present

## 2016-08-14 DIAGNOSIS — Z808 Family history of malignant neoplasm of other organs or systems: Secondary | ICD-10-CM | POA: Diagnosis not present

## 2016-08-14 DIAGNOSIS — K4030 Unilateral inguinal hernia, with obstruction, without gangrene, not specified as recurrent: Secondary | ICD-10-CM | POA: Diagnosis not present

## 2016-08-14 DIAGNOSIS — K219 Gastro-esophageal reflux disease without esophagitis: Secondary | ICD-10-CM | POA: Diagnosis not present

## 2016-08-14 DIAGNOSIS — Z8249 Family history of ischemic heart disease and other diseases of the circulatory system: Secondary | ICD-10-CM | POA: Diagnosis not present

## 2016-08-14 LAB — CBC
HCT: 41.3 % (ref 39.0–52.0)
Hemoglobin: 13.9 g/dL (ref 13.0–17.0)
MCH: 29.1 pg (ref 26.0–34.0)
MCHC: 33.7 g/dL (ref 30.0–36.0)
MCV: 86.4 fL (ref 78.0–100.0)
PLATELETS: 254 10*3/uL (ref 150–400)
RBC: 4.78 MIL/uL (ref 4.22–5.81)
RDW: 13.6 % (ref 11.5–15.5)
WBC: 10.5 10*3/uL (ref 4.0–10.5)

## 2016-08-14 LAB — BASIC METABOLIC PANEL
Anion gap: 8 (ref 5–15)
BUN: 15 mg/dL (ref 6–20)
CHLORIDE: 102 mmol/L (ref 101–111)
CO2: 30 mmol/L (ref 22–32)
CREATININE: 1.12 mg/dL (ref 0.61–1.24)
Calcium: 8.9 mg/dL (ref 8.9–10.3)
GFR calc Af Amer: >60 mL/min (ref >60)
GFR calc non Af Amer: >60 mL/min (ref >60)
GLUCOSE: 206 mg/dL — AB (ref 65–99)
Potassium: 3.4 mmol/L — ABNORMAL LOW (ref 3.5–5.1)
Sodium: 140 mmol/L (ref 135–145)

## 2016-08-14 NOTE — Progress Notes (Signed)
BMP done 08/14/16 routed to Dr. Kae Heller via epic

## 2016-08-14 NOTE — Progress Notes (Signed)
07/06/16 CXR  07/06/16 ekg 07/13/26 stress test 07/09/16 lov card   all in epic

## 2016-08-16 ENCOUNTER — Other Ambulatory Visit: Payer: Self-pay

## 2016-08-16 ENCOUNTER — Ambulatory Visit (INDEPENDENT_AMBULATORY_CARE_PROVIDER_SITE_OTHER): Payer: PPO

## 2016-08-16 DIAGNOSIS — R011 Cardiac murmur, unspecified: Secondary | ICD-10-CM | POA: Diagnosis not present

## 2016-08-16 DIAGNOSIS — R0789 Other chest pain: Secondary | ICD-10-CM | POA: Diagnosis not present

## 2016-08-16 DIAGNOSIS — Z01818 Encounter for other preprocedural examination: Secondary | ICD-10-CM | POA: Diagnosis not present

## 2016-08-17 ENCOUNTER — Ambulatory Visit (HOSPITAL_COMMUNITY): Payer: PPO | Admitting: Anesthesiology

## 2016-08-17 ENCOUNTER — Encounter (HOSPITAL_COMMUNITY)
Admission: RE | Disposition: A | Discharge status: Home / Self Care | Payer: Self-pay | Source: Ambulatory Visit | Attending: Surgery

## 2016-08-17 ENCOUNTER — Observation Stay (HOSPITAL_COMMUNITY)
Admission: RE | Admit: 2016-08-17 | Discharge: 2016-08-18 | Disposition: A | Discharge status: Home / Self Care | Payer: PPO | Source: Ambulatory Visit | Attending: Surgery | Admitting: Surgery

## 2016-08-17 ENCOUNTER — Encounter (HOSPITAL_COMMUNITY): Payer: Self-pay | Admitting: *Deleted

## 2016-08-17 DIAGNOSIS — Z808 Family history of malignant neoplasm of other organs or systems: Secondary | ICD-10-CM | POA: Diagnosis not present

## 2016-08-17 DIAGNOSIS — G4730 Sleep apnea, unspecified: Secondary | ICD-10-CM | POA: Diagnosis not present

## 2016-08-17 DIAGNOSIS — E7800 Pure hypercholesterolemia, unspecified: Secondary | ICD-10-CM | POA: Diagnosis not present

## 2016-08-17 DIAGNOSIS — K4030 Unilateral inguinal hernia, with obstruction, without gangrene, not specified as recurrent: Secondary | ICD-10-CM | POA: Diagnosis not present

## 2016-08-17 DIAGNOSIS — K219 Gastro-esophageal reflux disease without esophagitis: Secondary | ICD-10-CM | POA: Insufficient documentation

## 2016-08-17 DIAGNOSIS — Z8249 Family history of ischemic heart disease and other diseases of the circulatory system: Secondary | ICD-10-CM | POA: Diagnosis not present

## 2016-08-17 DIAGNOSIS — Z87891 Personal history of nicotine dependence: Secondary | ICD-10-CM | POA: Insufficient documentation

## 2016-08-17 DIAGNOSIS — K668 Other specified disorders of peritoneum: Secondary | ICD-10-CM | POA: Diagnosis not present

## 2016-08-17 DIAGNOSIS — I10 Essential (primary) hypertension: Secondary | ICD-10-CM | POA: Insufficient documentation

## 2016-08-17 DIAGNOSIS — Z85820 Personal history of malignant melanoma of skin: Secondary | ICD-10-CM | POA: Diagnosis not present

## 2016-08-17 DIAGNOSIS — Z7982 Long term (current) use of aspirin: Secondary | ICD-10-CM | POA: Diagnosis not present

## 2016-08-17 HISTORY — PX: INGUINAL HERNIA REPAIR: SHX194

## 2016-08-17 HISTORY — PX: LAPAROSCOPIC REMOVAL OF MESENTERIC MASS: SHX5917

## 2016-08-17 SURGERY — REPAIR, HERNIA, INGUINAL, LAPAROSCOPIC
Anesthesia: General | Laterality: Right

## 2016-08-17 MED ORDER — FAMOTIDINE 20 MG PO TABS
20.0000 mg | ORAL_TABLET | Freq: Every day | ORAL | Status: DC
  Administered 2016-08-17 – 2016-08-18 (×2): 20 mg via ORAL
  Filled 2016-08-17 (×2): qty 1

## 2016-08-17 MED ORDER — NAPROXEN 500 MG PO TABS
250.0000 mg | ORAL_TABLET | Freq: Two times a day (BID) | ORAL | Status: DC
  Administered 2016-08-17 – 2016-08-18 (×2): 250 mg via ORAL
  Filled 2016-08-17 (×2): qty 1

## 2016-08-17 MED ORDER — ONDANSETRON HCL 4 MG/2ML IJ SOLN
INTRAMUSCULAR | Status: DC | PRN
  Administered 2016-08-17: 4 mg via INTRAVENOUS

## 2016-08-17 MED ORDER — DEXAMETHASONE SODIUM PHOSPHATE 10 MG/ML IJ SOLN
INTRAMUSCULAR | Status: DC | PRN
  Administered 2016-08-17: 10 mg via INTRAVENOUS

## 2016-08-17 MED ORDER — ROCURONIUM BROMIDE 50 MG/5ML IV SOSY
PREFILLED_SYRINGE | INTRAVENOUS | Status: AC
  Filled 2016-08-17: qty 5

## 2016-08-17 MED ORDER — MEPERIDINE HCL 50 MG/ML IJ SOLN: 6.2500 mg | INTRAMUSCULAR | Status: DC | PRN

## 2016-08-17 MED ORDER — ROCURONIUM BROMIDE 50 MG/5ML IV SOSY
PREFILLED_SYRINGE | INTRAVENOUS | Status: AC
Start: 2016-08-17 — End: 2016-08-17
  Filled 2016-08-17: qty 5

## 2016-08-17 MED ORDER — PROPOFOL 10 MG/ML IV BOLUS
INTRAVENOUS | Status: DC | PRN
  Administered 2016-08-17: 120 mg via INTRAVENOUS

## 2016-08-17 MED ORDER — SUCCINYLCHOLINE CHLORIDE 200 MG/10ML IV SOSY
PREFILLED_SYRINGE | INTRAVENOUS | Status: AC
Start: 2016-08-17 — End: 2016-08-17
  Filled 2016-08-17: qty 10

## 2016-08-17 MED ORDER — ENOXAPARIN SODIUM 40 MG/0.4ML ~~LOC~~ SOLN
40.0000 mg | SUBCUTANEOUS | Status: DC
  Administered 2016-08-18: 40 mg via SUBCUTANEOUS
  Filled 2016-08-17: qty 0.4

## 2016-08-17 MED ORDER — HYDROMORPHONE HCL 1 MG/ML IJ SOLN: 0.5000 mg | INTRAMUSCULAR | Status: DC | PRN

## 2016-08-17 MED ORDER — CHLORHEXIDINE GLUCONATE CLOTH 2 % EX PADS: 6.0000 | MEDICATED_PAD | Freq: Once | CUTANEOUS | Status: DC

## 2016-08-17 MED ORDER — SODIUM CHLORIDE 0.9 % IJ SOLN
INTRAMUSCULAR | Status: DC | PRN
  Administered 2016-08-17: 20 mL

## 2016-08-17 MED ORDER — DOCUSATE SODIUM 100 MG PO CAPS
100.0000 mg | ORAL_CAPSULE | Freq: Two times a day (BID) | ORAL | Status: DC
  Administered 2016-08-18: 100 mg via ORAL
  Filled 2016-08-17: qty 1

## 2016-08-17 MED ORDER — AMLODIPINE BESYLATE 5 MG PO TABS
5.0000 mg | ORAL_TABLET | Freq: Every day | ORAL | Status: DC
  Administered 2016-08-18: 5 mg via ORAL
  Filled 2016-08-17: qty 1

## 2016-08-17 MED ORDER — FENTANYL CITRATE (PF) 100 MCG/2ML IJ SOLN
INTRAMUSCULAR | Status: DC | PRN
  Administered 2016-08-17: 150 ug via INTRAVENOUS
  Administered 2016-08-17 (×2): 50 ug via INTRAVENOUS

## 2016-08-17 MED ORDER — ALLOPURINOL 300 MG PO TABS
300.0000 mg | ORAL_TABLET | Freq: Every day | ORAL | Status: DC
  Administered 2016-08-18: 300 mg via ORAL
  Filled 2016-08-17: qty 1

## 2016-08-17 MED ORDER — SUGAMMADEX SODIUM 500 MG/5ML IV SOLN
INTRAVENOUS | Status: AC
  Filled 2016-08-17: qty 5

## 2016-08-17 MED ORDER — FENTANYL CITRATE (PF) 250 MCG/5ML IJ SOLN
INTRAMUSCULAR | Status: AC
Start: 2016-08-17 — End: 2016-08-17
  Filled 2016-08-17: qty 5

## 2016-08-17 MED ORDER — LIDOCAINE 2% (20 MG/ML) 5 ML SYRINGE
INTRAMUSCULAR | Status: AC
  Filled 2016-08-17: qty 5

## 2016-08-17 MED ORDER — ONDANSETRON HCL 4 MG/2ML IJ SOLN: 4.0000 mg | Freq: Four times a day (QID) | INTRAMUSCULAR | Status: DC | PRN

## 2016-08-17 MED ORDER — BISACODYL 10 MG RE SUPP: 10.0000 mg | Freq: Every day | RECTAL | Status: DC | PRN

## 2016-08-17 MED ORDER — LACTATED RINGERS IV SOLN
INTRAVENOUS | Status: DC
Start: 2016-08-17 — End: 2016-08-17
  Administered 2016-08-17 (×2): via INTRAVENOUS

## 2016-08-17 MED ORDER — OXYCODONE-ACETAMINOPHEN 5-325 MG PO TABS: 1.0000 | ORAL_TABLET | ORAL | Status: DC | PRN

## 2016-08-17 MED ORDER — HYDRALAZINE HCL 20 MG/ML IJ SOLN: 10.0000 mg | INTRAMUSCULAR | Status: DC | PRN

## 2016-08-17 MED ORDER — BUPIVACAINE LIPOSOME 1.3 % IJ SUSP
20.0000 mL | Freq: Once | INTRAMUSCULAR | Status: AC
  Administered 2016-08-17: 20 mL
  Filled 2016-08-17: qty 20

## 2016-08-17 MED ORDER — EPHEDRINE SULFATE-NACL 50-0.9 MG/10ML-% IV SOSY
PREFILLED_SYRINGE | INTRAVENOUS | Status: DC | PRN
  Administered 2016-08-17: 10 mg via INTRAVENOUS

## 2016-08-17 MED ORDER — NAPROXEN SODIUM 220 MG PO TABS
220.0000 mg | ORAL_TABLET | Freq: Two times a day (BID) | ORAL | Status: DC
  Filled 2016-08-17: qty 1

## 2016-08-17 MED ORDER — SODIUM CHLORIDE 0.9 % IJ SOLN
INTRAMUSCULAR | Status: AC
  Filled 2016-08-17: qty 50

## 2016-08-17 MED ORDER — METHOCARBAMOL 500 MG PO TABS: 500.0000 mg | ORAL_TABLET | Freq: Four times a day (QID) | ORAL | Status: DC | PRN

## 2016-08-17 MED ORDER — DIPHENHYDRAMINE HCL 50 MG/ML IJ SOLN: 12.5000 mg | Freq: Four times a day (QID) | INTRAMUSCULAR | Status: DC | PRN

## 2016-08-17 MED ORDER — EPHEDRINE 5 MG/ML INJ
INTRAVENOUS | Status: AC
  Filled 2016-08-17: qty 10

## 2016-08-17 MED ORDER — CEFAZOLIN SODIUM-DEXTROSE 2-4 GM/100ML-% IV SOLN
2.0000 g | INTRAVENOUS | Status: AC
  Administered 2016-08-17: 2 g via INTRAVENOUS
  Filled 2016-08-17: qty 100

## 2016-08-17 MED ORDER — METOPROLOL TARTRATE 50 MG PO TABS
50.0000 mg | ORAL_TABLET | Freq: Two times a day (BID) | ORAL | Status: DC
  Administered 2016-08-17 – 2016-08-18 (×2): 50 mg via ORAL
  Filled 2016-08-17 (×2): qty 1

## 2016-08-17 MED ORDER — DIPHENHYDRAMINE HCL 12.5 MG/5ML PO ELIX: 12.5000 mg | ORAL_SOLUTION | Freq: Four times a day (QID) | ORAL | Status: DC | PRN

## 2016-08-17 MED ORDER — DEXAMETHASONE SODIUM PHOSPHATE 10 MG/ML IJ SOLN
INTRAMUSCULAR | Status: AC
  Filled 2016-08-17: qty 1

## 2016-08-17 MED ORDER — MIDAZOLAM HCL 2 MG/2ML IJ SOLN
INTRAMUSCULAR | Status: DC | PRN
  Administered 2016-08-17: 2 mg via INTRAVENOUS

## 2016-08-17 MED ORDER — SUGAMMADEX SODIUM 200 MG/2ML IV SOLN
INTRAVENOUS | Status: DC | PRN
  Administered 2016-08-17: 300 mg via INTRAVENOUS

## 2016-08-17 MED ORDER — ONDANSETRON HCL 4 MG/2ML IJ SOLN
INTRAMUSCULAR | Status: AC
  Filled 2016-08-17: qty 2

## 2016-08-17 MED ORDER — SENNA 8.6 MG PO TABS
1.0000 | ORAL_TABLET | Freq: Two times a day (BID) | ORAL | Status: DC
  Administered 2016-08-17 – 2016-08-18 (×2): 8.6 mg via ORAL
  Filled 2016-08-17 (×2): qty 1

## 2016-08-17 MED ORDER — ACETAMINOPHEN 325 MG PO TABS
325.0000 mg | ORAL_TABLET | Freq: Four times a day (QID) | ORAL | Status: DC
  Administered 2016-08-17 – 2016-08-18 (×3): 325 mg via ORAL
  Filled 2016-08-17 (×3): qty 1

## 2016-08-17 MED ORDER — ROSUVASTATIN CALCIUM 20 MG PO TABS
20.0000 mg | ORAL_TABLET | ORAL | Status: DC
  Administered 2016-08-17: 20 mg via ORAL
  Filled 2016-08-17: qty 1

## 2016-08-17 MED ORDER — HYDROMORPHONE HCL 1 MG/ML IJ SOLN: 0.2500 mg | INTRAMUSCULAR | Status: DC | PRN

## 2016-08-17 MED ORDER — ONDANSETRON 4 MG PO TBDP: 4.0000 mg | ORAL_TABLET | Freq: Four times a day (QID) | ORAL | Status: DC | PRN

## 2016-08-17 MED ORDER — LIDOCAINE 2% (20 MG/ML) 5 ML SYRINGE
INTRAMUSCULAR | Status: DC | PRN
  Administered 2016-08-17: 100 mg via INTRAVENOUS

## 2016-08-17 MED ORDER — MIDAZOLAM HCL 2 MG/2ML IJ SOLN
INTRAMUSCULAR | Status: AC
  Filled 2016-08-17: qty 2

## 2016-08-17 MED ORDER — ROCURONIUM BROMIDE 10 MG/ML (PF) SYRINGE
PREFILLED_SYRINGE | INTRAVENOUS | Status: DC | PRN
  Administered 2016-08-17 (×3): 10 mg via INTRAVENOUS
  Administered 2016-08-17: 50 mg via INTRAVENOUS

## 2016-08-17 MED ORDER — ONDANSETRON HCL 4 MG/2ML IJ SOLN: 4.0000 mg | Freq: Once | INTRAMUSCULAR | Status: DC | PRN

## 2016-08-17 MED ORDER — OXYCODONE HCL 5 MG PO TABS: 5.0000 mg | ORAL_TABLET | Freq: Four times a day (QID) | ORAL | 0 refills | Status: DC | PRN

## 2016-08-17 MED ORDER — SUGAMMADEX SODIUM 200 MG/2ML IV SOLN
INTRAVENOUS | Status: AC
  Filled 2016-08-17: qty 2

## 2016-08-17 SURGICAL SUPPLY — 32 items
ADH SKN CLS APL DERMABOND .7 (GAUZE/BANDAGES/DRESSINGS) ×2
CABLE HIGH FREQUENCY MONO STRZ (ELECTRODE) ×4 IMPLANT
CHLORAPREP W/TINT 26ML (MISCELLANEOUS) ×4 IMPLANT
COVER SURGICAL LIGHT HANDLE (MISCELLANEOUS) ×4 IMPLANT
DECANTER SPIKE VIAL GLASS SM (MISCELLANEOUS) ×4 IMPLANT
DERMABOND ADVANCED (GAUZE/BANDAGES/DRESSINGS) ×2
DERMABOND ADVANCED .7 DNX12 (GAUZE/BANDAGES/DRESSINGS) ×2 IMPLANT
DEVICE SECURE STRAP 25 ABSORB (INSTRUMENTS) ×4 IMPLANT
ELECT REM PT RETURN 9FT ADLT (ELECTROSURGICAL) ×4
ELECTRODE REM PT RTRN 9FT ADLT (ELECTROSURGICAL) ×2 IMPLANT
GLOVE BIO SURGEON STRL SZ 6 (GLOVE) ×4 IMPLANT
GLOVE INDICATOR 6.5 STRL GRN (GLOVE) ×4 IMPLANT
GOWN STRL REUS W/TWL LRG LVL3 (GOWN DISPOSABLE) ×4 IMPLANT
GOWN STRL REUS W/TWL XL LVL3 (GOWN DISPOSABLE) ×8 IMPLANT
GRASPER SUT TROCAR 14GX15 (MISCELLANEOUS) ×4 IMPLANT
IRRIG SUCT STRYKERFLOW 2 WTIP (MISCELLANEOUS) ×4
IRRIGATION SUCT STRKRFLW 2 WTP (MISCELLANEOUS) ×1 IMPLANT
KIT BASIN OR (CUSTOM PROCEDURE TRAY) ×4 IMPLANT
MESH 3DMAX LIGHT 4.1X6.2 RT LR (Mesh General) ×4 IMPLANT
NEEDLE INSUFFLATION 14GA 120MM (NEEDLE) ×4 IMPLANT
SCISSORS LAP 5X35 DISP (ENDOMECHANICALS) ×4 IMPLANT
SLEEVE XCEL OPT CAN 5 100 (ENDOMECHANICALS) ×4 IMPLANT
SUT MNCRL AB 4-0 PS2 18 (SUTURE) ×4 IMPLANT
SUT VIC AB 3-0 SH 27 (SUTURE) ×4
SUT VIC AB 3-0 SH 27XBRD (SUTURE) ×2 IMPLANT
TOWEL OR 17X26 10 PK STRL BLUE (TOWEL DISPOSABLE) ×4 IMPLANT
TOWEL OR NON WOVEN STRL DISP B (DISPOSABLE) ×4 IMPLANT
TRAY LAPAROSCOPIC (CUSTOM PROCEDURE TRAY) ×4 IMPLANT
TROCAR BLADELESS OPT 5 100 (ENDOMECHANICALS) ×4 IMPLANT
TROCAR XCEL 12X100 BLDLESS (ENDOMECHANICALS) ×4 IMPLANT
TROCAR XCEL BLUNT TIP 100MML (ENDOMECHANICALS) ×1 IMPLANT
TUBING INSUF HEATED (TUBING) ×4 IMPLANT

## 2016-08-17 NOTE — Discharge Instructions (Signed)
[facility] ______[facility]   INGUINAL HERNIA REPAIR: POST OP INSTRUCTIONS  Always review your discharge instruction sheet given to you by the facility where your surgery was performed. IF YOU HAVE DISABILITY OR FAMILY LEAVE FORMS, YOU MUST BRING THEM TO THE OFFICE FOR PROCESSING.   DO NOT GIVE THEM TO YOUR DOCTOR.  1. A  prescription for pain medication may be given to you upon discharge.  Take your pain medication as prescribed, if needed.  If narcotic pain medicine is not needed, then you may take acetaminophen (Tylenol) or ibuprofen (Advil) as needed. 2. Take your usually prescribed medications unless otherwise directed. If you need a refill on your pain medication, please contact your pharmacy.  They will contact our office to request authorization. Prescriptions will not be filled after 5 pm or on week-ends. 3. You should follow a light diet the first 24 hours after arrival home, such as soup and crackers, etc.  Be sure to include lots of fluids daily.  Resume your normal diet the day after surgery. 4.Most patients will experience some swelling and bruising around the incisions, the umbilicus or in the groin and scrotum.  Ice packs and reclining will help.  Swelling and bruising can take several days to resolve.  6. It is common to experience some constipation if taking pain medication after surgery.  Increasing fluid intake and taking a stool softener (such as Colace) will usually help or prevent this problem from occurring.  A mild laxative (Milk of Magnesia or Miralax) should be taken according to package directions if there are no bowel movements after 48 hours. 7. Unless discharge instructions indicate otherwise, you may remove your bandages 24-48 hours after surgery, and you may shower at that time.  You may have steri-strips (small skin tapes) in place directly over the incision.  These strips should be left on the skin for 7-10 days.  If your surgeon used skin glue on the  incision, you may shower in 24 hours.  The glue will flake off over the next 2-3 weeks.  Any sutures or staples will be removed at the office during your follow-up visit. 8. ACTIVITIES:  You may resume regular (light) daily activities beginning the next day--such as daily self-care, walking, climbing stairs--gradually increasing activities as tolerated.  You may have sexual intercourse when it is comfortable.  Refrain from any heavy lifting or straining until approved by your doctor.  a.You may drive when you are no longer taking prescription pain medication, you can comfortably wear a seatbelt, and you can safely maneuver your car and apply brakes. b.RETURN TO WORK:  1-2 weeks _____________________________________________  9.You should see your doctor in the office for a follow-up appointment approximately 2-3 weeks after your surgery.  Make sure that you call for this appointment within a day or two after you arrive home to insure a convenient appointment time. 10.OTHER INSTRUCTIONS: _________________________    _____________________________________  WHEN TO CALL YOUR DOCTOR: 1. Fever over 101.0 2. Inability to urinate 3. Nausea and/or vomiting 4. Extreme swelling or bruising 5. Continued bleeding from incision. 6. Increased pain, redness, or drainage from the incision  The clinic staff is available to answer your questions during regular business hours.  Please dont hesitate to call and ask to speak to one of the nurses for clinical concerns.  If you have a medical emergency, go to the nearest emergency room or call 911.  A surgeon from [facility] is always on call at the hospital  [address], [address], [place], [place]  [address] ? ° [address] [address], [place], [place]   [address] °[phone] ? [phone] ? FAX [phone] °Web site: [email or web address] °

## 2016-08-17 NOTE — Transfer of Care (Signed)
Immediate Anesthesia Transfer of Care Note  Patient: Allen Pierce  Procedure(s) Performed: Procedure(s): LAPAROSCOPIC RIGHT INGUINAL HERNIA REPAIR (Right) BIOPSY OF MESENTERIC MASS (N/A)  Patient Location: PACU  Anesthesia Type:General  Level of Consciousness: awake, alert  and oriented  Airway & Oxygen Therapy: Patient Spontanous Breathing and Patient connected to face mask oxygen  Post-op Assessment: Report given to RN and Post -op Vital signs reviewed and stable  Post vital signs: Reviewed and stable  Last Vitals:  Vitals:   08/17/16 0805  BP: (!) 157/82  Pulse: (!) 55  Resp: 16  Temp: 36.8 C    Last Pain:  Vitals:   08/17/16 0805  TempSrc: Oral         Complications: No apparent anesthesia complications

## 2016-08-17 NOTE — H&P (Signed)
Allen Pierce Patient #: [id number] DOB: [birth year] Married / Language: English / Race: White Male  History of Present Illness  Patient words: This is a very nice 76 year old man who is referred for upper abdominal pain. He began experiencing this about a month ago, and it first happened when he was getting up out of bed in the morning. The pain is across his upper abdomen and subcostal margins and dissipates within minutes of sitting down. The other times it is happens it has been when he is going from a seated or supine to a standing position and again resolves once he sits back down. There is no associated nausea or emesis. There is no associated change in appetite or bowel function. There is no association with any particular food or time of day. On one occasion the pain did radiate to his back and along his right flank consistent with his prior experiences of kidney stones and this resolves after taking an additional oxycodone. He has had the pain episodes as often as 3-4 times a week but for example last week he had no episodes of pain. His primary care physician had him complete a abdominal x-ray as well as a CT scan. The CT scan revealed a 3.5 cm calcified soft tissue mass adjacent to distal small bowel mesentery; this was present on a CT scan that was performed in 2005 and has not changed at all. The CT also confirmed a large right inguinal hernia with some loops of small bowel and cecum within it. There is no pathology to explain his upper abdominal pain. He denies chest pain or shortness of breath, other than during episodes of pain where he feels like he can't hardly take a deep breath. He also has a right inguinal hernia which is known about for about 5 months. It has started to cause him some mild discomfort which is improved by change in position. He is interested in having this repaired as well.    Past Surgical History  No pertinent past surgical history  Diagnostic  Studies History Colonoscopy within last year  Allergies  No Known Drug Allergies 06/07/2016  Medication History Metoprolol Tartrate (50MG  Tablet, Oral) Active. AmLODIPine Besylate (5MG  Tablet, Oral) Active. Rosuvastatin Calcium (20MG  Tablet, Oral) Active. Valsartan-Hydrochlorothiazide (320-25MG  Tablet, Oral) Active. Aspirin (81MG  Tablet, Oral) Active. Allopurinol (300MG  Tablet, Oral) Active. Oxycodone-Acetaminophen (5-325MG  Tablet, Oral) Active. Klor-Con 10 (10MEQ Tablet ER, Oral) Active. Niaspan (500MG  Tablet ER, Oral) Active. Medications Reconciled  Social History  Caffeine use Carbonated beverages, Coffee, Tea. No alcohol use No drug use Tobacco use Former smoker.  Family History  Cancer Brother. Cerebrovascular Accident Father. Colon Polyps Brother. Diabetes Mellitus Brother. Hypertension Brother, Father, Mother. Melanoma Brother.  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    Review of Systems General Not Present- Appetite Loss, Chills, Fatigue, Fever, Night Sweats, Weight Gain and Weight Loss. Skin Not Present- Change in Wart/Mole, Dryness, Hives, Jaundice, New Lesions, Non-Healing Wounds, Rash and Ulcer. HEENT Present- Ringing in the Ears, Seasonal Allergies and Wears glasses/contact lenses. Not Present- Earache, Hearing Loss, Hoarseness, Nose Bleed, Oral Ulcers, Sinus Pain, Sore Throat, Visual Disturbances and Yellow Eyes. Respiratory Present- Snoring. Not Present- Bloody sputum, Chronic Cough, Difficulty Breathing and Wheezing. Breast Not Present- Breast Mass, Breast Pain, Nipple Discharge and Skin Changes. Cardiovascular Present- Swelling of Extremities. Not Present- Chest Pain, Difficulty  Breathing Lying Down, Leg Cramps,  Palpitations, Rapid Heart Rate and Shortness of Breath. Gastrointestinal Present- Abdominal Pain, Change in Bowel Habits, Hemorrhoids and Indigestion. Not Present- Bloating, Bloody Stool, Chronic diarrhea, Constipation, Difficulty Swallowing, Excessive gas, Gets full quickly at meals, Nausea, Rectal Pain and Vomiting. Male Genitourinary Present- Nocturia and Urgency. Not Present- Blood in Urine, Change in Urinary Stream, Frequency, Impotence, Painful Urination and Urine Leakage. Musculoskeletal Present- Back Pain. Not Present- Joint Pain, Joint Stiffness, Muscle Pain, Muscle Weakness and Swelling of Extremities. Neurological Present- Decreased Memory. Not Present- Fainting, Headaches, Numbness, Seizures, Tingling, Tremor, Trouble walking and Weakness. Psychiatric Not Present- Anxiety, Bipolar, Change in Sleep Pattern, Depression, Fearful and Frequent crying. Endocrine Not Present- Cold Intolerance, Excessive Hunger, Hair Changes, Heat Intolerance, Hot flashes and New Diabetes. Hematology Not Present- Blood Thinners, Easy Bruising, Excessive bleeding, Gland problems, HIV and Persistent Infections.  Vitals:   08/17/16 0805  BP: (!) 157/82  Pulse: (!) 55  Resp: 16  Temp: 98.2 F (36.8 C)       Physical Exam  The physical exam findings are as follows: Note:He is alert and oriented, no distress Unlabored respirations. The abdomen is soft nontender nondistended. No mass or organomegaly.He has never had surgery there are no scars. There is a large right inguinal hernia which is partially reducible.    Assessment & Plan   ABDOMINAL PAIN (R10.9) Story: This pain which is quite responsive to positional changes is atypical for intra-abdominal pathology. He does not have signs or symptoms that would make me concerned for gallbladder disease, and although he does have an abnormal finding on his CT scan, it has been present for over a decade and his symptoms do not sound like  intermittent obstruction or ischemia, in fact they sound much more musculoskeletal to me. Although it would also be atypical, I would like him to see a cardiologist and get a stress test to rule out atypical angina given the fact that the pain is relieved by resting in association with his age and history of hypertension.  RIGHT INGUINAL HERNIA (K40.90) Story: This is a large and reduces when the patient is supine. I recommended that he have it repaired based on the fact that there is now bowel within the hernia. We discussed that the best option would probably be a laparoscopic repair, this would afford the opportunity to look at the other side as well as to survey the abdomen and further evaluate this calcified mesenteric mass. If this is easily found and removable without significant sequelae, I would consider removing while there for the hernia surgery. We discussed using mesh, the risks of surgery including pain, scarring, injury to adjacent structures including bowel and nerve tissue and blood vessels, discussed the low risk of recurrence. We also discussed signs and symptoms of incarceration and strangulation which should provoke him to go to the emergency department. He understands and is agreeable to proceeding with surgery.

## 2016-08-17 NOTE — Op Note (Signed)
Operative Note  Allen Pierce  [id number]  [id number]  08/17/2016   Surgeon: Clovis Riley  Assistant: OR staff  Procedure performed: Laparoscopic repair of chronically incarcerated right indirect inguinal hernia with Bard 3d max light large mesh, biopsy of mesenteric mass  Preop diagnosis: Chronically incarcerated right inguinal hernia, chronic and stable mesenteric mass Post-op diagnosis/intraop findings: same  Specimens: biopsy of mesenteric mass Retained items: none EBL: 69CV Complications: none  Description of procedure: After obtaining informed consent the patient was taken to the operating room and placed supine on operating room table wheregeneral endotracheal anesthesia was initiated, preoperative antibiotics were administered, SCDs applied, and a formal timeout was performed. The abdomen was prepped and draped in the usual sterile fashion. The peritoneal cavity was accessed with an infraumbilical veress needle, insufflated to 57mmHg without incident and a 76mm trocar and camera were placed. Gross inspection revealed no evidence of injury from our entry. The patient was placed in steep reverse trendelenburg. A right 69mm and left 58mm trocar were placed under direct visualization. The left groin was inspected. There were adhesions of the sigmoid colon the peritoneum overlying the region of the internal ring but no hernia was apparent. On the right side a large indirect hernia was present with a long segment of chronically incarcerated small bowel and cecum. The bowel was gently and easily reduced. There was dense adhesions of the very terminal ileum to the peritoneum at the base of the hernia sac. A peritoneal flap was created with cautery and sharp dissection spanning from just medial to the ASIS to the medial umbilical ligament. The peritoneum was chronically thickened. Careful dissection ensued to reduce the hernia sac. This was quite tedious due to the chronic and large  quality of the hernia. Ultimately the sac was freed and the cord structures were visualized and preserved. Inferior blunt dissection continued to make space for the mesh, taking care around the femoral and gonadal vessels. The cooper's ligament was exposed. A large Bard 3d max light mesh was introduced through the 84mm trocar and sat nicely in the field with adequate overlap of the hernia defect. This was tacked medially to coopers ligament and then the superior edge of the mesh was tacked to the abdominal wall on either side of the inferior epigastric vessels using a securestrap tacker. The peritoneal flap was then lifted and re-apposed to the abdominal wall, completely covering the mesh which remained nice and flat over the hernia defect. The nearby bowel was inspected and the terminal ileum where it had remained adherent to the hernia sac had a small hematoma of the wall, about 69mm in diameter. There was no full thickness injury. I elected to place a full-thickness figure of eight suture of 3-0 vicryl here to reinforce the serosa. I then turned to the proximal small bowel, which was run until I encountered the known, chronic and stable mesenteric mass. This was at the juncture of mesentery and bowel in the jejunum. Resection would likely require small bowel resection. The mass was white and very firm. A biopsy forceps was used to take a small sample of tissue. Finally, a laparoscopic-assisted TAPS block was performed on the right with exparel. The 24mm trocar site was then closed with a 0-vicryl in the fascia using a laparoscopic suture passer under direct visualization. The abdomen was then desufflated and all trocars removed. The remaining exparel was infiltrated into each incision. The skin incisions were closed with subcuticular monocryl and dermabond. The patient was then awakened, extubated  and taken to PACU in stable condition.   All counts were correct at the completion of the case.

## 2016-08-17 NOTE — Anesthesia Procedure Notes (Signed)
Procedure Name: Intubation Date/Time: 08/17/2016 10:23 AM Performed by: Danley Danker L Patient Re-evaluated:Patient Re-evaluated prior to inductionOxygen Delivery Method: Circle system utilized Preoxygenation: Pre-oxygenation with 100% oxygen Intubation Type: IV induction Ventilation: Mask ventilation without difficulty and Oral airway inserted - appropriate to patient size Laryngoscope Size: Miller and 3 Grade View: Grade II Tube type: Oral Tube size: 8.0 mm Number of attempts: 1 Airway Equipment and Method: Stylet Placement Confirmation: ETT inserted through vocal cords under direct vision,  positive ETCO2 and breath sounds checked- equal and bilateral Secured at: 22 cm Tube secured with: Tape Dental Injury: Teeth and Oropharynx as per pre-operative assessment

## 2016-08-17 NOTE — Anesthesia Preprocedure Evaluation (Signed)
Anesthesia Evaluation  Patient identified by MRN, date of birth, ID band Patient awake    Reviewed: Allergy & Precautions, NPO status , Patient's Chart, lab work & pertinent test results  Airway Mallampati: I  TM Distance: >3 FB Neck ROM: Full    Dental   Pulmonary sleep apnea , former smoker,    Pulmonary exam normal        Cardiovascular hypertension, Pt. on medications Normal cardiovascular exam     Neuro/Psych    GI/Hepatic GERD  Medicated and Controlled,  Endo/Other    Renal/GU      Musculoskeletal   Abdominal   Peds  Hematology   Anesthesia Other Findings   Reproductive/Obstetrics                             Anesthesia Physical Anesthesia Plan  ASA: II  Anesthesia Plan: General   Post-op Pain Management:    Induction: Intravenous  Airway Management Planned: Oral ETT  Additional Equipment:   Intra-op Plan:   Post-operative Plan: Extubation in OR  Informed Consent: I have reviewed the patients History and Physical, chart, labs and discussed the procedure including the risks, benefits and alternatives for the proposed anesthesia with the patient or authorized representative who has indicated his/her understanding and acceptance.     Plan Discussed with: CRNA and Surgeon  Anesthesia Plan Comments:         Anesthesia Quick Evaluation

## 2016-08-17 NOTE — Anesthesia Postprocedure Evaluation (Signed)
Anesthesia Post Note  Patient: Allen Pierce  Procedure(s) Performed: Procedure(s) (LRB): LAPAROSCOPIC RIGHT INGUINAL HERNIA REPAIR (Right) BIOPSY OF MESENTERIC MASS (N/A)  Patient location during evaluation: PACU Anesthesia Type: General Level of consciousness: awake and alert Pain management: pain level controlled Vital Signs Assessment: post-procedure vital signs reviewed and stable Respiratory status: spontaneous breathing, nonlabored ventilation, respiratory function stable and patient connected to nasal cannula oxygen Cardiovascular status: blood pressure returned to baseline and stable Postop Assessment: no signs of nausea or vomiting Anesthetic complications: no       Last Vitals:  Vitals:   08/17/16 1443 08/17/16 1545  BP: (!) 153/83 136/77  Pulse: 71 60  Resp: 16 16  Temp: 36.4 C 36.6 C    Last Pain:  Vitals:   08/17/16 1545  TempSrc: Oral                 Saliyah Gillin DAVID

## 2016-08-18 DIAGNOSIS — K4030 Unilateral inguinal hernia, with obstruction, without gangrene, not specified as recurrent: Secondary | ICD-10-CM | POA: Diagnosis not present

## 2016-08-18 LAB — BASIC METABOLIC PANEL
ANION GAP: 8 (ref 5–15)
BUN: 16 mg/dL (ref 6–20)
CALCIUM: 9 mg/dL (ref 8.9–10.3)
CHLORIDE: 103 mmol/L (ref 101–111)
CO2: 29 mmol/L (ref 22–32)
CREATININE: 1.13 mg/dL (ref 0.61–1.24)
Glucose, Bld: 131 mg/dL — ABNORMAL HIGH (ref 65–99)
POTASSIUM: 3.6 mmol/L (ref 3.5–5.1)
SODIUM: 140 mmol/L (ref 135–145)

## 2016-08-18 LAB — CBC
HEMATOCRIT: 38 % — AB (ref 39.0–52.0)
HEMOGLOBIN: 13.1 g/dL (ref 13.0–17.0)
MCH: 29.6 pg (ref 26.0–34.0)
MCHC: 34.5 g/dL (ref 30.0–36.0)
MCV: 85.8 fL (ref 78.0–100.0)
Platelets: 239 10*3/uL (ref 150–400)
RBC: 4.43 MIL/uL (ref 4.22–5.81)
RDW: 13.7 % (ref 11.5–15.5)
WBC: 17.1 10*3/uL — ABNORMAL HIGH (ref 4.0–10.5)

## 2016-08-18 LAB — GLUCOSE, CAPILLARY: GLUCOSE-CAPILLARY: 113 mg/dL — AB (ref 65–99)

## 2016-08-18 NOTE — Discharge Summary (Signed)
Physician Discharge Summary  Patient ID: KINGDOM VANZANTEN MRN: [id number] DOB/AGE: [birth year] 76 y.o.  Admit date: 08/17/2016 Discharge date: 08/18/2016  Admission Diagnoses:  Discharge Diagnoses:  Active Problems:   Incarcerated right inguinal hernia   Discharged Condition: good  Hospital Course: uneventful post op recovery after surgery.  Discharged POD#1  Consults: None  Significant Diagnostic Studies:   Treatments: surgery: laparoscopic right inguinal hernia repair with mesh, biopsy mesenteric mass  Discharge Exam: Blood pressure 135/70, pulse (!) 56, temperature 97.6 F (36.4 C), temperature source Oral, resp. rate 16, height 6' (1.829 m), weight 107 kg (236 lb), SpO2 99 %. General appearance: alert, cooperative and no distress Resp: clear to auscultation bilaterally Cardio: regular rate and rhythm, S1, S2 normal, no murmur, click, rub or gallop Incision/Wound:abdomen soft, minimally tender, incisions clean  Disposition: Final discharge disposition not confirmed   Allergies as of 08/18/2016   No Known Allergies     Medication List    TAKE these medications   allopurinol 300 MG tablet Commonly known as:  ZYLOPRIM Take 300 mg by mouth daily.   amLODipine 5 MG tablet Commonly known as:  NORVASC Take 5 mg by mouth daily.   aspirin EC 81 MG tablet Take 81 mg by mouth every evening.   metoprolol 50 MG tablet Commonly known as:  LOPRESSOR Take 50 mg by mouth 2 (two) times daily.   mineral oil liquid Take 10 mLs by mouth at bedtime.   naproxen sodium 220 MG tablet Commonly known as:  ANAPROX Take 220 mg by mouth 2 (two) times daily with a meal.   niacin 1000 MG CR tablet Commonly known as:  NIASPAN Take 500 mg by mouth every evening.   oxyCODONE 5 MG immediate release tablet Commonly known as:  ROXICODONE Take 1 tablet (5 mg total) by mouth every 6 (six) hours as needed for severe pain.   oxyCODONE-acetaminophen 5-325 MG tablet Commonly known as:   PERCOCET/ROXICET Take 1 tablet by mouth every evening.   rosuvastatin 10 MG tablet Commonly known as:  CRESTOR Take 20 mg by mouth every other day.   STOOL SOFTENER PO Take 1 tablet by mouth 2 (two) times daily.   ZANTAC PO Take 1 tablet by mouth daily as needed (heartburn).      Follow-up Information    Clovis Riley, MD Follow up in 3 week(s).   Specialty:  General Surgery Contact information: [phone]           Signed: Coralie Keens A 08/18/2016, 8:08 AM

## 2016-08-18 NOTE — Progress Notes (Signed)
Nurse reviewed discharge instructions with pt.  Pt verbalized understanding of discharge instructions, follow up appointments and new medication.  No concerns at time of discharge.  Prescription given to pt prior to discharge.

## 2016-08-18 NOTE — Progress Notes (Signed)
1 Day Post-Op  Subjective: Feels well Minimal pain Voiding well  Objective: Vital signs in last 24 hours: Temp:  [97.6 F (36.4 C)-98.2 F (36.8 C)] 97.6 F (36.4 C) (03/17 0522) Pulse Rate:  [55-73] 56 (03/17 0522) Resp:  [14-16] 16 (03/17 0522) BP: (135-157)/(70-88) 135/70 (03/17 0522) SpO2:  [96 %-100 %] 99 % (03/17 0522) Weight:  [107 kg (236 lb)] 107 kg (236 lb) (03/16 0814) Last BM Date: 08/17/16  Intake/Output from previous day: 03/16 0701 - 03/17 0700 In: 3095 [P.O.:720; I.V.:2375] Out: 2450 [Urine:2425; Blood:25] Intake/Output this shift: No intake/output data recorded.  Exam: Abdomen soft and almost non tender  Lab Results:   Recent Labs  08/18/16 0504  WBC 17.1*  HGB 13.1  HCT 38.0*  PLT 239   BMET  Recent Labs  08/18/16 0504  NA 140  K 3.6  CL 103  CO2 29  GLUCOSE 131*  BUN 16  CREATININE 1.13  CALCIUM 9.0   PT/INR No results for input(s): LABPROT, INR in the last 72 hours. ABG No results for input(s): PHART, HCO3 in the last 72 hours.  Invalid input(s): PCO2, PO2  Studies/Results: No results found.  Anti-infectives: Anti-infectives    Start     Dose/Rate Route Frequency Ordered Stop   08/17/16 0801  ceFAZolin (ANCEF) IVPB 2g/100 mL premix     2 g 200 mL/hr over 30 Minutes Intravenous On call to O.R. 08/17/16 0801 08/17/16 1025      Assessment/Plan: s/p Procedure(s): LAPAROSCOPIC RIGHT INGUINAL HERNIA REPAIR (Right) BIOPSY OF MESENTERIC MASS (N/A) Doing well post op Will discharge home today  LOS: 0 days    Aarush Stukey A 08/18/2016

## 2016-08-27 DIAGNOSIS — I10 Essential (primary) hypertension: Secondary | ICD-10-CM | POA: Diagnosis not present

## 2016-08-27 DIAGNOSIS — M109 Gout, unspecified: Secondary | ICD-10-CM | POA: Diagnosis not present

## 2016-08-27 DIAGNOSIS — K219 Gastro-esophageal reflux disease without esophagitis: Secondary | ICD-10-CM | POA: Diagnosis not present

## 2016-08-27 DIAGNOSIS — Z09 Encounter for follow-up examination after completed treatment for conditions other than malignant neoplasm: Secondary | ICD-10-CM | POA: Diagnosis not present

## 2016-09-06 DIAGNOSIS — R229 Localized swelling, mass and lump, unspecified: Secondary | ICD-10-CM | POA: Diagnosis not present

## 2016-09-06 DIAGNOSIS — Z9889 Other specified postprocedural states: Secondary | ICD-10-CM | POA: Diagnosis not present

## 2016-09-06 DIAGNOSIS — Z8719 Personal history of other diseases of the digestive system: Secondary | ICD-10-CM | POA: Diagnosis not present

## 2016-09-06 DIAGNOSIS — G8929 Other chronic pain: Secondary | ICD-10-CM | POA: Diagnosis not present

## 2016-11-27 DIAGNOSIS — H52221 Regular astigmatism, right eye: Secondary | ICD-10-CM | POA: Diagnosis not present

## 2016-11-27 DIAGNOSIS — H04123 Dry eye syndrome of bilateral lacrimal glands: Secondary | ICD-10-CM | POA: Diagnosis not present

## 2016-11-27 DIAGNOSIS — H524 Presbyopia: Secondary | ICD-10-CM | POA: Diagnosis not present

## 2016-11-27 DIAGNOSIS — H5203 Hypermetropia, bilateral: Secondary | ICD-10-CM | POA: Diagnosis not present

## 2016-12-10 DIAGNOSIS — X32XXXD Exposure to sunlight, subsequent encounter: Secondary | ICD-10-CM | POA: Diagnosis not present

## 2016-12-10 DIAGNOSIS — L570 Actinic keratosis: Secondary | ICD-10-CM | POA: Diagnosis not present

## 2016-12-21 ENCOUNTER — Ambulatory Visit: Payer: Self-pay | Admitting: Surgery

## 2016-12-21 DIAGNOSIS — K4091 Unilateral inguinal hernia, without obstruction or gangrene, recurrent: Secondary | ICD-10-CM | POA: Diagnosis not present

## 2016-12-21 NOTE — H&P (Signed)
Allen Pierce 12/21/2016 2:49 PM Location: [facility] Patient #: [id number] DOB: 05/30/1941 Married / Language: English / Race: White Male  History of Present Illness (Allen Pierce; 12/21/2016 4:16 PM) Patient words: This is a very pleasant gentleman who I did a laparoscopic right inguinal hernia repair on 4 months ago. He had been healing very well and really wasn't having any issues. He's been 100% compliant with all activity restrictions. A couple of weeks ago he started to notice a bulge in the right groin which has gradually increased in size. It is mildly uncomfortable for him if he is doing any strenuous physical activity but otherwise is not bothering him aside from this presents concerning to him for recurrent hernia. He is not having issues with eating or bowel movements. No urinary symptoms. He had a large indirect right inguinal hernia with chronically incarcerated small bowel and cecum; and I noted dense adhesions of the very terminal ileum to the peritoneum at the base of the hernia sac. Otherwise ultimately the procedure went well and mesh was placed with adequate overlap of the hernia defect.  The patient is a 76 year old male.   Medication History Benjiman Core, CMA; 12/21/2016 2:52 PM) Zantac (150MG  Tablet, Oral) Active. Mineral Oil Light Active. Naproxen (250MG  Tablet, Oral as needed) Active. Docusate Sodium (100MG  Capsule, Oral as needed) Active. Metoprolol Tartrate (50MG  Tablet, Oral) Active. AmLODIPine Besylate (5MG  Tablet, Oral) Active. Rosuvastatin Calcium (20MG  Tablet, Oral) Active. Aspirin (81MG  Tablet, Oral) Active. Allopurinol (300MG  Tablet, Oral) Active. Medications Reconciled     Review of Systems (Fitzgerald Dunne A. Kae Heller Pierce; 12/21/2016 4:16 PM) All other systems negative  Vitals (Armen Glenn CMA; 12/21/2016 2:50 PM) 12/21/2016 2:50 PM Weight: 238.5 lb Height: 73in Body Surface Area: 2.32 m Body Mass Index: 31.47 kg/m   Temp.: 98.23F  Pulse: 76 (Regular)  P.OX: 97% (Room air) BP: 130/82 (Sitting, Left Arm, Standard)      Physical Exam (Raahi Korber A. Kae Heller Pierce; 12/21/2016 4:18 PM)  General Note: He is alert and well-appearing  Integumentary Note: Skin is warm and dry  Chest and Lung Exam Note: Unlabored respirations, symmetrical air entry  Cardiovascular Note: Regular rate and rhythm, no pedal edema  Abdomen Note: Soft, nontender nondistended. Laparoscopic incisions have healed very well. There is a recurrent, easily reducible hernia in the right groin with palpable bowel when herniated. No tenderness or overlying skin changes.  Neurologic Note: Grossly intact, normal gait    Assessment & Plan (Shaquille Janes A. 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 I offered him repeat repair this time with an open approach and again we discussed use of mesh and the risks of surgery including bleeding, pain, scarring, infection, injury to intra-abdominal structures, hernia recurrence, chronic groin pain or urinary problems. He presents understanding and desires to proceed. I described to him once again the signs of incarceration or strangulation and indications to present to the emergency room. He expressed understanding. We'll get him scheduled in the next few weeks.

## 2016-12-31 DIAGNOSIS — M5442 Lumbago with sciatica, left side: Secondary | ICD-10-CM | POA: Diagnosis not present

## 2016-12-31 DIAGNOSIS — K5792 Diverticulitis of intestine, part unspecified, without perforation or abscess without bleeding: Secondary | ICD-10-CM | POA: Diagnosis not present

## 2016-12-31 DIAGNOSIS — R109 Unspecified abdominal pain: Secondary | ICD-10-CM | POA: Diagnosis not present

## 2017-01-02 ENCOUNTER — Other Ambulatory Visit: Payer: Self-pay | Admitting: Physician Assistant

## 2017-01-02 DIAGNOSIS — K4090 Unilateral inguinal hernia, without obstruction or gangrene, not specified as recurrent: Secondary | ICD-10-CM | POA: Diagnosis not present

## 2017-01-02 DIAGNOSIS — R1032 Left lower quadrant pain: Secondary | ICD-10-CM

## 2017-01-02 DIAGNOSIS — K5792 Diverticulitis of intestine, part unspecified, without perforation or abscess without bleeding: Secondary | ICD-10-CM

## 2017-01-07 ENCOUNTER — Ambulatory Visit
Admission: RE | Admit: 2017-01-07 | Discharge: 2017-01-07 | Disposition: A | Discharge status: Home / Self Care | Payer: PPO | Source: Ambulatory Visit | Attending: Physician Assistant | Admitting: Physician Assistant

## 2017-01-07 DIAGNOSIS — K4090 Unilateral inguinal hernia, without obstruction or gangrene, not specified as recurrent: Secondary | ICD-10-CM

## 2017-01-07 DIAGNOSIS — K5792 Diverticulitis of intestine, part unspecified, without perforation or abscess without bleeding: Secondary | ICD-10-CM

## 2017-01-07 DIAGNOSIS — R1032 Left lower quadrant pain: Secondary | ICD-10-CM

## 2017-01-07 DIAGNOSIS — N201 Calculus of ureter: Secondary | ICD-10-CM | POA: Diagnosis not present

## 2017-01-07 MED ORDER — IOPAMIDOL (ISOVUE-300) INJECTION 61%
125.0000 mL | Freq: Once | INTRAVENOUS | Status: AC | PRN
  Administered 2017-01-07: 125 mL via INTRAVENOUS

## 2017-01-08 ENCOUNTER — Other Ambulatory Visit: Payer: PPO

## 2017-01-10 DIAGNOSIS — Z7982 Long term (current) use of aspirin: Secondary | ICD-10-CM | POA: Diagnosis not present

## 2017-01-10 DIAGNOSIS — E782 Mixed hyperlipidemia: Secondary | ICD-10-CM | POA: Diagnosis not present

## 2017-01-10 DIAGNOSIS — Z0000 Encounter for general adult medical examination without abnormal findings: Secondary | ICD-10-CM | POA: Diagnosis not present

## 2017-01-10 DIAGNOSIS — N390 Urinary tract infection, site not specified: Secondary | ICD-10-CM | POA: Diagnosis not present

## 2017-01-10 DIAGNOSIS — I10 Essential (primary) hypertension: Secondary | ICD-10-CM | POA: Diagnosis not present

## 2017-01-10 DIAGNOSIS — Z125 Encounter for screening for malignant neoplasm of prostate: Secondary | ICD-10-CM | POA: Diagnosis not present

## 2017-01-17 DIAGNOSIS — M5430 Sciatica, unspecified side: Secondary | ICD-10-CM | POA: Diagnosis not present

## 2017-01-17 DIAGNOSIS — M4807 Spinal stenosis, lumbosacral region: Secondary | ICD-10-CM | POA: Diagnosis not present

## 2017-01-17 DIAGNOSIS — E876 Hypokalemia: Secondary | ICD-10-CM | POA: Diagnosis not present

## 2017-01-17 DIAGNOSIS — L578 Other skin changes due to chronic exposure to nonionizing radiation: Secondary | ICD-10-CM | POA: Diagnosis not present

## 2017-01-17 DIAGNOSIS — M109 Gout, unspecified: Secondary | ICD-10-CM | POA: Diagnosis not present

## 2017-01-17 DIAGNOSIS — K648 Other hemorrhoids: Secondary | ICD-10-CM | POA: Diagnosis not present

## 2017-01-17 DIAGNOSIS — D72829 Elevated white blood cell count, unspecified: Secondary | ICD-10-CM | POA: Diagnosis not present

## 2017-01-17 DIAGNOSIS — K133 Hairy leukoplakia: Secondary | ICD-10-CM | POA: Diagnosis not present

## 2017-01-17 DIAGNOSIS — E782 Mixed hyperlipidemia: Secondary | ICD-10-CM | POA: Diagnosis not present

## 2017-01-17 DIAGNOSIS — I10 Essential (primary) hypertension: Secondary | ICD-10-CM | POA: Diagnosis not present

## 2017-01-17 DIAGNOSIS — Z7982 Long term (current) use of aspirin: Secondary | ICD-10-CM | POA: Diagnosis not present

## 2017-01-17 DIAGNOSIS — K219 Gastro-esophageal reflux disease without esophagitis: Secondary | ICD-10-CM | POA: Diagnosis not present

## 2017-01-18 DIAGNOSIS — K4090 Unilateral inguinal hernia, without obstruction or gangrene, not specified as recurrent: Secondary | ICD-10-CM | POA: Diagnosis not present

## 2017-01-18 DIAGNOSIS — R1032 Left lower quadrant pain: Secondary | ICD-10-CM | POA: Diagnosis not present

## 2017-01-21 DIAGNOSIS — N201 Calculus of ureter: Secondary | ICD-10-CM | POA: Diagnosis not present

## 2017-01-22 ENCOUNTER — Other Ambulatory Visit: Payer: Self-pay | Admitting: Urology

## 2017-01-23 ENCOUNTER — Encounter (HOSPITAL_COMMUNITY): Payer: Self-pay

## 2017-01-23 ENCOUNTER — Encounter (HOSPITAL_COMMUNITY)
Admission: RE | Admit: 2017-01-23 | Discharge: 2017-01-23 | Disposition: A | Discharge status: Home / Self Care | Payer: PPO | Source: Ambulatory Visit | Attending: Surgery | Admitting: Surgery

## 2017-01-23 DIAGNOSIS — I10 Essential (primary) hypertension: Secondary | ICD-10-CM | POA: Diagnosis not present

## 2017-01-23 DIAGNOSIS — D176 Benign lipomatous neoplasm of spermatic cord: Secondary | ICD-10-CM | POA: Diagnosis not present

## 2017-01-23 DIAGNOSIS — Z85820 Personal history of malignant melanoma of skin: Secondary | ICD-10-CM | POA: Diagnosis not present

## 2017-01-23 DIAGNOSIS — K219 Gastro-esophageal reflux disease without esophagitis: Secondary | ICD-10-CM | POA: Diagnosis not present

## 2017-01-23 DIAGNOSIS — E785 Hyperlipidemia, unspecified: Secondary | ICD-10-CM | POA: Diagnosis not present

## 2017-01-23 DIAGNOSIS — Z7982 Long term (current) use of aspirin: Secondary | ICD-10-CM | POA: Diagnosis not present

## 2017-01-23 DIAGNOSIS — K4091 Unilateral inguinal hernia, without obstruction or gangrene, recurrent: Secondary | ICD-10-CM | POA: Diagnosis not present

## 2017-01-23 DIAGNOSIS — Z87891 Personal history of nicotine dependence: Secondary | ICD-10-CM | POA: Diagnosis not present

## 2017-01-23 DIAGNOSIS — E876 Hypokalemia: Secondary | ICD-10-CM | POA: Diagnosis not present

## 2017-01-23 HISTORY — DX: Personal history of other diseases of the digestive system: Z87.19

## 2017-01-23 HISTORY — DX: Cardiac arrhythmia, unspecified: I49.9

## 2017-01-23 HISTORY — DX: Unspecified atrial fibrillation: I48.91

## 2017-01-23 LAB — CBC WITH DIFFERENTIAL/PLATELET
BASOS ABS: 0.1 10*3/uL (ref 0.0–0.1)
Basophils Relative: 1 %
EOS ABS: 0.3 10*3/uL (ref 0.0–0.7)
EOS PCT: 3 %
HCT: 41 % (ref 39.0–52.0)
Hemoglobin: 13.8 g/dL (ref 13.0–17.0)
LYMPHS ABS: 2.4 10*3/uL (ref 0.7–4.0)
Lymphocytes Relative: 27 %
MCH: 29.1 pg (ref 26.0–34.0)
MCHC: 33.7 g/dL (ref 30.0–36.0)
MCV: 86.5 fL (ref 78.0–100.0)
Monocytes Absolute: 0.7 10*3/uL (ref 0.1–1.0)
Monocytes Relative: 8 %
Neutro Abs: 5.6 10*3/uL (ref 1.7–7.7)
Neutrophils Relative %: 61 %
PLATELETS: 246 10*3/uL (ref 150–400)
RBC: 4.74 MIL/uL (ref 4.22–5.81)
RDW: 13.6 % (ref 11.5–15.5)
WBC: 9 10*3/uL (ref 4.0–10.5)

## 2017-01-23 LAB — BASIC METABOLIC PANEL
Anion gap: 8 (ref 5–15)
BUN: 12 mg/dL (ref 6–20)
CO2: 28 mmol/L (ref 22–32)
CREATININE: 1.09 mg/dL (ref 0.61–1.24)
Calcium: 9.2 mg/dL (ref 8.9–10.3)
Chloride: 104 mmol/L (ref 101–111)
GFR calc Af Amer: >60 mL/min (ref >60)
Glucose, Bld: 114 mg/dL — ABNORMAL HIGH (ref 65–99)
Potassium: 3.3 mmol/L — ABNORMAL LOW (ref 3.5–5.1)
SODIUM: 140 mmol/L (ref 135–145)

## 2017-01-23 NOTE — Pre-Procedure Instructions (Signed)
Allen Pierce  01/23/2017      [id number] - [place], [place] - [address] [address] Phone: [phone] Fax: [phone]    Your procedure is scheduled on Friday, January 25, 2017  Report to [facility] Admitting Entrance "A" at 5:30 A.M.   Call this number if you have problems the morning of surgery:  [phone]   Remember:  Do not eat food or drink liquids after midnight on January 24, 2017  Take these medicines the morning of surgery with A SIP OF WATER: AmLODipine (NORVASC), Metoprolol (LOPRESSOR), and Allopurinol (ZYLOPRIM). If needed Ranitidine (ZANTAC) for heartburn and OxyCODONE (ROXICODONE) for pain.  As of today, stop taking all Aspirins, Vitamins, Fish oils, and Herbal medications. Also stop all NSAIDS i.e. Advil, Motrin, Aleve, Anaprox, Naproxen, BC and Goody Powders.   Do not wear jewelry.  Do not wear lotions, powders, or perfumes, or deodorant.  Do not shave 48 hours prior to surgery.  Men may shave face and neck.  Do not bring valuables to the hospital.  [facility] is not responsible for any belongings or valuables.  Contacts, dentures or bridgework may not be worn into surgery.  Leave your suitcase in the car.  After surgery it may be brought to your room.  For patients admitted to the hospital, discharge time will be determined by your treatment team.  Patients discharged the day of surgery will not be allowed to drive home.   Special instructions:   [place]- Preparing For Surgery  Before surgery, you can play an important role. Because skin is not sterile, your skin needs to be as free of germs as possible. You can reduce the number of germs on your skin by washing with CHG (chlorahexidine gluconate) Soap before surgery.  CHG is an antiseptic cleaner which kills germs and bonds with the skin to continue killing germs even after washing.  Please do not use if you have an allergy to CHG or  antibacterial soaps. If your skin becomes reddened/irritated stop using the CHG.  Do not shave (including legs and underarms) for at least 48 hours prior to first CHG shower. It is OK to shave your face.  Please follow these instructions carefully.   1. Shower the NIGHT BEFORE SURGERY and the MORNING OF SURGERY with CHG.   2. If you chose to wash your hair, wash your hair first as usual with your normal shampoo.  3. After you shampoo, rinse your hair and body thoroughly to remove the shampoo.  4. Use CHG as you would any other liquid soap. You can apply CHG directly to the skin and wash gently with a scrungie or a clean washcloth.   5. Apply the CHG Soap to your body ONLY FROM THE NECK DOWN.  Do not use on open wounds or open sores. Avoid contact with your eyes, ears, mouth and genitals (private parts). Wash genitals (private parts) with your normal soap.  6. Wash thoroughly, paying special attention to the area where your surgery will be performed.  7. Thoroughly rinse your body with warm water from the neck down.  8. DO NOT shower/wash with your normal soap after using and rinsing off the CHG Soap.  9. Pat yourself dry with a CLEAN TOWEL.   10. Wear CLEAN PAJAMAS   11. Place CLEAN SHEETS on your bed the night of your first shower and DO NOT SLEEP WITH PETS.  Day of Surgery: Do  not apply any deodorants/lotions. Please wear clean clothes to the hospital/surgery center.    Please read over the following fact sheets that you were given. Pain Booklet, Coughing and Deep Breathing and Surgical Site Infection Prevention

## 2017-01-23 NOTE — Progress Notes (Signed)
PCP - Dr. Deland Pretty  Cardiologist - Denies  Chest x-ray - 07/06/16 (E)  EKG - 07/06/16 (E- Abn)- Requested a previous reading  Stress Test - 07/13/16 (E)  ECHO - 08/16/16 (E)  Cardiac Cath - Denies  Sleep Study - Yes CPAP - None- Pt sts he stopped using x5 years    Pt denies having chest pain, sob, or fever at this time. All instructions explained to the pt, with a verbal understanding of the material. Pt agrees to go over the instructions while at home for a better understanding. The opportunity to ask questions was provided.

## 2017-01-25 ENCOUNTER — Ambulatory Visit (HOSPITAL_COMMUNITY)
Admission: RE | Admit: 2017-01-25 | Discharge: 2017-01-25 | Disposition: A | Discharge status: Home / Self Care | Payer: PPO | Source: Ambulatory Visit | Attending: Surgery | Admitting: Surgery

## 2017-01-25 ENCOUNTER — Encounter (HOSPITAL_COMMUNITY)
Admission: RE | Disposition: A | Discharge status: Home / Self Care | Payer: Self-pay | Source: Ambulatory Visit | Attending: Surgery

## 2017-01-25 ENCOUNTER — Ambulatory Visit (HOSPITAL_COMMUNITY): Payer: PPO | Admitting: Anesthesiology

## 2017-01-25 ENCOUNTER — Encounter (HOSPITAL_COMMUNITY): Payer: Self-pay | Admitting: *Deleted

## 2017-01-25 DIAGNOSIS — Z85820 Personal history of malignant melanoma of skin: Secondary | ICD-10-CM | POA: Diagnosis not present

## 2017-01-25 DIAGNOSIS — Z87891 Personal history of nicotine dependence: Secondary | ICD-10-CM | POA: Diagnosis not present

## 2017-01-25 DIAGNOSIS — K4091 Unilateral inguinal hernia, without obstruction or gangrene, recurrent: Secondary | ICD-10-CM | POA: Diagnosis not present

## 2017-01-25 DIAGNOSIS — D176 Benign lipomatous neoplasm of spermatic cord: Secondary | ICD-10-CM | POA: Diagnosis not present

## 2017-01-25 DIAGNOSIS — K4031 Unilateral inguinal hernia, with obstruction, without gangrene, recurrent: Secondary | ICD-10-CM | POA: Diagnosis not present

## 2017-01-25 DIAGNOSIS — I10 Essential (primary) hypertension: Secondary | ICD-10-CM | POA: Diagnosis not present

## 2017-01-25 DIAGNOSIS — K219 Gastro-esophageal reflux disease without esophagitis: Secondary | ICD-10-CM | POA: Diagnosis not present

## 2017-01-25 DIAGNOSIS — E785 Hyperlipidemia, unspecified: Secondary | ICD-10-CM | POA: Diagnosis not present

## 2017-01-25 DIAGNOSIS — Z7982 Long term (current) use of aspirin: Secondary | ICD-10-CM | POA: Diagnosis not present

## 2017-01-25 HISTORY — PX: INGUINAL HERNIA REPAIR: SHX194

## 2017-01-25 HISTORY — PX: INSERTION OF MESH: SHX5868

## 2017-01-25 SURGERY — REPAIR, HERNIA, INGUINAL, ADULT
Anesthesia: General | Site: Inguinal | Laterality: Right

## 2017-01-25 MED ORDER — PROPOFOL 10 MG/ML IV BOLUS
INTRAVENOUS | Status: DC | PRN
  Administered 2017-01-25: 170 mg via INTRAVENOUS

## 2017-01-25 MED ORDER — MIDAZOLAM HCL 5 MG/5ML IJ SOLN
INTRAMUSCULAR | Status: DC | PRN
  Administered 2017-01-25: 1 mg via INTRAVENOUS

## 2017-01-25 MED ORDER — FENTANYL CITRATE (PF) 100 MCG/2ML IJ SOLN: 25.0000 ug | INTRAMUSCULAR | Status: DC | PRN

## 2017-01-25 MED ORDER — SODIUM CHLORIDE 0.9% FLUSH: 3.0000 mL | INTRAVENOUS | Status: DC | PRN

## 2017-01-25 MED ORDER — EPHEDRINE 5 MG/ML INJ
INTRAVENOUS | Status: AC
  Filled 2017-01-25: qty 10

## 2017-01-25 MED ORDER — LIDOCAINE HCL (CARDIAC) 20 MG/ML IV SOLN
INTRAVENOUS | Status: DC | PRN
  Administered 2017-01-25: 40 mg via INTRAVENOUS

## 2017-01-25 MED ORDER — FENTANYL CITRATE (PF) 100 MCG/2ML IJ SOLN
25.0000 ug | INTRAMUSCULAR | Status: DC | PRN
  Administered 2017-01-25 (×2): 50 ug via INTRAVENOUS

## 2017-01-25 MED ORDER — FENTANYL CITRATE (PF) 100 MCG/2ML IJ SOLN
INTRAMUSCULAR | Status: AC
  Filled 2017-01-25: qty 2

## 2017-01-25 MED ORDER — ROCURONIUM BROMIDE 100 MG/10ML IV SOLN
INTRAVENOUS | Status: DC | PRN
  Administered 2017-01-25: 20 mg via INTRAVENOUS
  Administered 2017-01-25: 50 mg via INTRAVENOUS

## 2017-01-25 MED ORDER — ACETAMINOPHEN 325 MG PO TABS: 650.0000 mg | ORAL_TABLET | ORAL | Status: DC | PRN

## 2017-01-25 MED ORDER — CEFAZOLIN SODIUM-DEXTROSE 2-4 GM/100ML-% IV SOLN
2.0000 g | INTRAVENOUS | Status: AC
  Administered 2017-01-25: 2 g via INTRAVENOUS
  Filled 2017-01-25: qty 100

## 2017-01-25 MED ORDER — GABAPENTIN 300 MG PO CAPS
300.0000 mg | ORAL_CAPSULE | ORAL | Status: AC
  Administered 2017-01-25: 300 mg via ORAL
  Filled 2017-01-25: qty 1

## 2017-01-25 MED ORDER — LIDOCAINE 2% (20 MG/ML) 5 ML SYRINGE
INTRAMUSCULAR | Status: AC
  Filled 2017-01-25: qty 5

## 2017-01-25 MED ORDER — 0.9 % SODIUM CHLORIDE (POUR BTL) OPTIME
TOPICAL | Status: DC | PRN
  Administered 2017-01-25: 1000 mL

## 2017-01-25 MED ORDER — ONDANSETRON HCL 4 MG/2ML IJ SOLN
INTRAMUSCULAR | Status: AC
  Filled 2017-01-25: qty 2

## 2017-01-25 MED ORDER — BUPIVACAINE-EPINEPHRINE (PF) 0.25% -1:200000 IJ SOLN
INTRAMUSCULAR | Status: AC
  Filled 2017-01-25: qty 30

## 2017-01-25 MED ORDER — SUGAMMADEX SODIUM 200 MG/2ML IV SOLN
INTRAVENOUS | Status: AC
  Filled 2017-01-25: qty 2

## 2017-01-25 MED ORDER — ONDANSETRON HCL 4 MG/2ML IJ SOLN
INTRAMUSCULAR | Status: DC | PRN
  Administered 2017-01-25: 4 mg via INTRAVENOUS

## 2017-01-25 MED ORDER — BUPIVACAINE-EPINEPHRINE 0.25% -1:200000 IJ SOLN
INTRAMUSCULAR | Status: DC | PRN
  Administered 2017-01-25: 8 mL

## 2017-01-25 MED ORDER — LACTATED RINGERS IV SOLN
INTRAVENOUS | Status: DC | PRN
  Administered 2017-01-25 (×2): via INTRAVENOUS

## 2017-01-25 MED ORDER — ONDANSETRON HCL 4 MG/2ML IJ SOLN: 4.0000 mg | Freq: Once | INTRAMUSCULAR | Status: DC | PRN

## 2017-01-25 MED ORDER — DEXAMETHASONE SODIUM PHOSPHATE 10 MG/ML IJ SOLN
INTRAMUSCULAR | Status: AC
  Filled 2017-01-25: qty 1

## 2017-01-25 MED ORDER — CELECOXIB 200 MG PO CAPS
400.0000 mg | ORAL_CAPSULE | ORAL | Status: AC
  Administered 2017-01-25: 400 mg via ORAL
  Filled 2017-01-25: qty 2

## 2017-01-25 MED ORDER — FENTANYL CITRATE (PF) 100 MCG/2ML IJ SOLN
INTRAMUSCULAR | Status: DC | PRN
  Administered 2017-01-25 (×2): 50 ug via INTRAVENOUS
  Administered 2017-01-25: 100 ug via INTRAVENOUS

## 2017-01-25 MED ORDER — ROCURONIUM BROMIDE 10 MG/ML (PF) SYRINGE
PREFILLED_SYRINGE | INTRAVENOUS | Status: AC
  Filled 2017-01-25: qty 5

## 2017-01-25 MED ORDER — ACETAMINOPHEN 650 MG RE SUPP: 650.0000 mg | RECTAL | Status: DC | PRN

## 2017-01-25 MED ORDER — MIDAZOLAM HCL 2 MG/2ML IJ SOLN
INTRAMUSCULAR | Status: AC
  Filled 2017-01-25: qty 2

## 2017-01-25 MED ORDER — FENTANYL CITRATE (PF) 250 MCG/5ML IJ SOLN
INTRAMUSCULAR | Status: AC
  Filled 2017-01-25: qty 5

## 2017-01-25 MED ORDER — OXYCODONE HCL 5 MG PO TABS
5.0000 mg | ORAL_TABLET | Freq: Once | ORAL | Status: DC | PRN
Start: 2017-01-25 — End: 2017-01-25

## 2017-01-25 MED ORDER — CHLORHEXIDINE GLUCONATE 4 % EX LIQD: 60.0000 mL | Freq: Once | CUTANEOUS | Status: DC

## 2017-01-25 MED ORDER — DEXAMETHASONE SODIUM PHOSPHATE 10 MG/ML IJ SOLN
INTRAMUSCULAR | Status: DC | PRN
  Administered 2017-01-25: 10 mg via INTRAVENOUS

## 2017-01-25 MED ORDER — SODIUM CHLORIDE 0.9 % IV SOLN: 250.0000 mL | INTRAVENOUS | Status: DC | PRN

## 2017-01-25 MED ORDER — EPHEDRINE SULFATE 50 MG/ML IJ SOLN
INTRAMUSCULAR | Status: DC | PRN
  Administered 2017-01-25 (×3): 10 mg via INTRAVENOUS

## 2017-01-25 MED ORDER — OXYCODONE-ACETAMINOPHEN 5-325 MG PO TABS: 1.0000 | ORAL_TABLET | Freq: Four times a day (QID) | ORAL | 0 refills | Status: DC | PRN

## 2017-01-25 MED ORDER — SUGAMMADEX SODIUM 200 MG/2ML IV SOLN
INTRAVENOUS | Status: DC | PRN
  Administered 2017-01-25: 50 mg via INTRAVENOUS
  Administered 2017-01-25: 200 mg via INTRAVENOUS

## 2017-01-25 MED ORDER — OXYCODONE HCL 5 MG/5ML PO SOLN: 5.0000 mg | Freq: Once | ORAL | Status: DC | PRN

## 2017-01-25 MED ORDER — DOCUSATE SODIUM 100 MG PO CAPS: 100.0000 mg | ORAL_CAPSULE | Freq: Two times a day (BID) | ORAL | 0 refills | Status: AC

## 2017-01-25 MED ORDER — PROPOFOL 10 MG/ML IV BOLUS
INTRAVENOUS | Status: AC
  Filled 2017-01-25: qty 20

## 2017-01-25 MED ORDER — OXYCODONE HCL 5 MG PO TABS: 5.0000 mg | ORAL_TABLET | ORAL | Status: DC | PRN

## 2017-01-25 MED ORDER — SODIUM CHLORIDE 0.9% FLUSH: 3.0000 mL | Freq: Two times a day (BID) | INTRAVENOUS | Status: DC

## 2017-01-25 MED ORDER — ACETAMINOPHEN 500 MG PO TABS
1000.0000 mg | ORAL_TABLET | ORAL | Status: AC
  Administered 2017-01-25: 1000 mg via ORAL
  Filled 2017-01-25: qty 2

## 2017-01-25 SURGICAL SUPPLY — 55 items
ADH SKN CLS APL DERMABOND .7 (GAUZE/BANDAGES/DRESSINGS) ×1
APL SKNCLS STERI-STRIP NONHPOA (GAUZE/BANDAGES/DRESSINGS) ×1
BENZOIN TINCTURE PRP APPL 2/3 (GAUZE/BANDAGES/DRESSINGS) ×2 IMPLANT
BLADE CLIPPER SURG (BLADE) ×3 IMPLANT
BLADE SURG 15 STRL LF DISP TIS (BLADE) ×1 IMPLANT
BLADE SURG 15 STRL SS (BLADE) ×3
CHLORAPREP W/TINT 26ML (MISCELLANEOUS) ×6 IMPLANT
CLOSURE STERI-STRIP 1/2X4 (GAUZE/BANDAGES/DRESSINGS) ×1
CLSR STERI-STRIP ANTIMIC 1/2X4 (GAUZE/BANDAGES/DRESSINGS) ×2 IMPLANT
COVER SURGICAL LIGHT HANDLE (MISCELLANEOUS) ×3 IMPLANT
DERMABOND ADVANCED (GAUZE/BANDAGES/DRESSINGS) ×2
DERMABOND ADVANCED .7 DNX12 (GAUZE/BANDAGES/DRESSINGS) ×1 IMPLANT
DRAIN PENROSE 1/2X12 LTX STRL (WOUND CARE) ×3 IMPLANT
DRAPE LAPAROTOMY TRNSV 102X78 (DRAPE) ×3 IMPLANT
DRAPE UTILITY XL STRL (DRAPES) ×3 IMPLANT
ELECT CAUTERY BLADE 6.4 (BLADE) ×3 IMPLANT
ELECT REM PT RETURN 9FT ADLT (ELECTROSURGICAL) ×3
ELECTRODE REM PT RTRN 9FT ADLT (ELECTROSURGICAL) ×1 IMPLANT
GAUZE SPONGE 4X4 12PLY STRL LF (GAUZE/BANDAGES/DRESSINGS) ×3 IMPLANT
GAUZE SPONGE 4X4 16PLY XRAY LF (GAUZE/BANDAGES/DRESSINGS) ×3 IMPLANT
GLOVE BIO SURGEON STRL SZ 6 (GLOVE) ×9 IMPLANT
GLOVE BIOGEL PI IND STRL 6.5 (GLOVE) ×3 IMPLANT
GLOVE BIOGEL PI INDICATOR 6.5 (GLOVE) ×6
GLOVE SURG SIGNA 7.5 PF LTX (GLOVE) ×6 IMPLANT
GOWN STRL REUS W/ TWL LRG LVL3 (GOWN DISPOSABLE) ×4 IMPLANT
GOWN STRL REUS W/ TWL XL LVL3 (GOWN DISPOSABLE) IMPLANT
GOWN STRL REUS W/TWL LRG LVL3 (GOWN DISPOSABLE) ×12
GOWN STRL REUS W/TWL XL LVL3 (GOWN DISPOSABLE) ×3
KIT BASIN OR (CUSTOM PROCEDURE TRAY) ×3 IMPLANT
KIT ROOM TURNOVER OR (KITS) ×3 IMPLANT
MESH ULTRAPRO 3X6 7.6X15CM (Mesh General) ×3 IMPLANT
NEEDLE HYPO 25GX1X1/2 BEV (NEEDLE) ×3 IMPLANT
NS IRRIG 1000ML POUR BTL (IV SOLUTION) ×3 IMPLANT
PACK SURGICAL SETUP 50X90 (CUSTOM PROCEDURE TRAY) ×3 IMPLANT
PAD ARMBOARD 7.5X6 YLW CONV (MISCELLANEOUS) ×3 IMPLANT
PENCIL BUTTON HOLSTER BLD 10FT (ELECTRODE) ×3 IMPLANT
SUT ETHIBOND 0 MO6 C/R (SUTURE) ×6 IMPLANT
SUT MNCRL AB 4-0 PS2 18 (SUTURE) ×3 IMPLANT
SUT PDS AB 0 CT 36 (SUTURE) IMPLANT
SUT SILK 3 0 (SUTURE)
SUT SILK 3-0 18XBRD TIE 12 (SUTURE) IMPLANT
SUT VIC AB 0 CT2 27 (SUTURE) ×3 IMPLANT
SUT VIC AB 2-0 BRD 54 (SUTURE) ×3 IMPLANT
SUT VIC AB 2-0 SH 27 (SUTURE) ×3
SUT VIC AB 2-0 SH 27X BRD (SUTURE) ×1 IMPLANT
SUT VIC AB 3-0 SH 27 (SUTURE) ×6
SUT VIC AB 3-0 SH 27X BRD (SUTURE) ×1 IMPLANT
SUT VIC AB 3-0 SH 27XBRD (SUTURE) ×1 IMPLANT
SYR BULB 3OZ (MISCELLANEOUS) ×3 IMPLANT
SYR CONTROL 10ML LL (SYRINGE) ×3 IMPLANT
TAPE CLOTH SURG 6X10 WHT LF (GAUZE/BANDAGES/DRESSINGS) ×2 IMPLANT
TOWEL OR 17X24 6PK STRL BLUE (TOWEL DISPOSABLE) ×3 IMPLANT
TOWEL OR 17X26 10 PK STRL BLUE (TOWEL DISPOSABLE) ×3 IMPLANT
TUBING BULK SUCTION (MISCELLANEOUS) ×3 IMPLANT
YANKAUER SUCT BULB TIP NO VENT (SUCTIONS) ×3 IMPLANT

## 2017-01-25 NOTE — H&P (Signed)
Surgical H&P  CC: recurrent inguinal hernia  HPI: recurrence of right inguinal hernia following lap repair in March.    No Known Allergies  Past Medical History:  Diagnosis Date  . A-fib ([place])   . AP (abdominal pain)   . Back pain    bad disc  . Dysrhythmia   . GERD (gastroesophageal reflux disease)   . Hemorrhoids   . History of hiatal hernia   . History of kidney stones   . HLD (hyperlipidemia)   . HTN (hypertension)   . 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 Right inguinal hernia   . Sleep apnea    discontinued 3-4 years ago  no longer wears a mask    Past Surgical History:  Procedure Laterality Date  . COLONOSCOPY     x 2 along with endoscopy each time   . DIAGNOSTIC LAPAROSCOPY    . HERNIA REPAIR     Right  . INGUINAL HERNIA REPAIR Right 08/17/2016   Procedure: LAPAROSCOPIC RIGHT INGUINAL HERNIA REPAIR;  Surgeon: Clovis Riley, MD;  Location: [facility];  Service: General;  Laterality: Right;  . LAPAROSCOPIC REMOVAL OF MESENTERIC MASS N/A 08/17/2016   Procedure: BIOPSY OF MESENTERIC MASS;  Surgeon: Clovis Riley, MD;  Location: [facility];  Service: General;  Laterality: N/A;    Family History  Problem Relation Age of Onset  . Hypertension Mother   . CVA Father   . Hypertension Father   . Diabetes Brother   . Hypertension Brother   . Colon polyps Brother   . Melanoma Brother     Social History   Social History  . Marital status: Married    Spouse name: N/A  . Number of children: N/A  . Years of education: N/A   Social History Main Topics  . Smoking status: Former Smoker    Types: Cigarettes  . Smokeless tobacco: Never Used     Comment: 1-2 years in high school  . Alcohol use No  . Drug use: No  . Sexual activity: Yes   Other Topics Concern  . None   Social History Narrative  . None    No current facility-administered medications on file prior to encounter.    Current Outpatient Prescriptions on File Prior to Encounter  Medication Sig  Dispense Refill  . allopurinol (ZYLOPRIM) 300 MG tablet Take 300 mg by mouth daily.    Marland Kitchen amLODipine (NORVASC) 5 MG tablet Take 5 mg by mouth daily.    Marland Kitchen aspirin EC 81 MG tablet Take 81 mg by mouth every evening.    Mariane Baumgarten Calcium (STOOL SOFTENER PO) Take 1 tablet by mouth 2 (two) times daily.    . metoprolol (LOPRESSOR) 50 MG tablet Take 50 mg by mouth 2 (two) times daily.    . naproxen sodium (ANAPROX) 220 MG tablet Take 220 mg by mouth 2 (two) times daily as needed.     Marland Kitchen oxyCODONE (ROXICODONE) 5 MG immediate release tablet Take 1 tablet (5 mg total) by mouth every 6 (six) hours as needed for severe pain. 15 tablet 0  . ranitidine (ZANTAC) 150 MG tablet Take 1 tablet by mouth daily as needed (heartburn).    . rosuvastatin (CRESTOR) 10 MG tablet Take 20 mg by mouth every other day.       Review of Systems: a complete, 10pt review of systems was completed with pertinent positives and negatives as documented in the HPI.   Physical Exam: Vitals:   01/25/17  0616  BP: (!) 163/84  Pulse: (!) 51  Resp: 18  Temp: 97.6 F (36.4 C)  SpO2: 98%   Alert, no distress Unlabored respirations Abdomen soft, right inguinal hernia present with vasalva  CBC Latest Ref Rng & Units 01/23/2017 08/18/2016 08/14/2016  WBC 4.0 - 10.5 K/uL 9.0 17.1(H) 10.5  Hemoglobin 13.0 - 17.0 g/dL 13.8 13.1 13.9  Hematocrit 39.0 - 52.0 % 41.0 38.0(L) 41.3  Platelets 150 - 400 K/uL 246 239 254    CMP Latest Ref Rng & Units 01/23/2017 08/18/2016 08/14/2016  Glucose 65 - 99 mg/dL 114(H) 131(H) 206(H)  BUN 6 - 20 mg/dL 12 16 15   Creatinine 0.61 - 1.24 mg/dL 1.09 1.13 1.12  Sodium 135 - 145 mmol/L 140 140 140  Potassium 3.5 - 5.1 mmol/L 3.3(L) 3.6 3.4(L)  Chloride 101 - 111 mmol/L 104 103 102  CO2 22 - 32 mmol/L 28 29 30   Calcium 8.9 - 10.3 mg/dL 9.2 9.0 8.9  Total Protein 6.5 - 8.1 g/dL - - -  Total Bilirubin 0.3 - 1.2 mg/dL - - -  Alkaline Phos 38 - 126 U/L - - -  AST 15 - 41 U/L - - -  ALT 17 - 63 U/L - - -     Lab Results  Component Value Date   INR 1.08 07/06/2016    Imaging: n/a  A/P: To OR for open repair of recurrent right inguinal hernia. Plan discharge home post-op.   Romana Juniper, MD [facility], [place] Pager [phone]

## 2017-01-25 NOTE — Transfer of Care (Signed)
Immediate Anesthesia Transfer of Care Note  Patient: Allen Pierce  Procedure(s) Performed: Procedure(s): RIGHT INGUINAL HERNIA REPAIR WITH MESH (Right) INSERTION OF MESH (Right)  Patient Location: PACU  Anesthesia Type:General  Level of Consciousness: awake, oriented and patient cooperative  Airway & Oxygen Therapy: Patient Spontanous Breathing and Patient connected to nasal cannula oxygen  Post-op Assessment: Report given to RN and Post -op Vital signs reviewed and stable  Post vital signs: Reviewed  Last Vitals:  Vitals:   01/25/17 0616  BP: (!) 163/84  Pulse: (!) 51  Resp: 18  Temp: 36.4 C  SpO2: 98%    Last Pain:  Vitals:   01/25/17 0616  TempSrc: Oral         Complications: No apparent anesthesia complications

## 2017-01-25 NOTE — Discharge Instructions (Signed)
HERNIA REPAIR: POST OP INSTRUCTIONS ° °###################################################################### ° °EAT °Gradually transition to a high fiber diet with a fiber supplement over the next few weeks after discharge.  Start with a pureed / full liquid diet (see below) ° °WALK °Walk an hour a day.  Control your pain to do that.   ° °CONTROL PAIN °Control pain so that you can walk, sleep, tolerate sneezing/coughing, go up/down stairs. ° °HAVE A BOWEL MOVEMENT DAILY °Keep your bowels regular to avoid problems.  OK to try a laxative to override constipation.  OK to use an antidairrheal to slow down diarrhea.  Call if not better after 2 tries ° °CALL IF YOU HAVE PROBLEMS/CONCERNS °Call if you are still struggling despite following these instructions. °Call if you have concerns not answered by these instructions ° °###################################################################### ° ° ° °1. DIET: Follow a light bland diet the first 24 hours after arrival home, such as soup, liquids, crackers, etc.  Be sure to include lots of fluids daily.  Avoid fast food or heavy meals as your are more likely to get nauseated.  Eat a low fat the next few days after surgery. °2. Take your usually prescribed home medications unless otherwise directed. °3. PAIN CONTROL: °a. Pain is best controlled by a usual combination of three different methods TOGETHER: °i. Ice/Heat °ii. Over the counter pain medication °iii. Prescription pain medication °b. Most patients will experience some swelling and bruising around the hernia(s) such as the bellybutton, groins, or old incisions.  Ice packs or heating pads (30-60 minutes up to 6 times a day) will help. Use ice for the first few days to help decrease swelling and bruising, then switch to heat to help relax tight/sore spots and speed recovery.  Some people prefer to use ice alone, heat alone, alternating between ice & heat.  Experiment to what works for you.  Swelling and bruising can take  several weeks to resolve.   °c. It is helpful to take an over-the-counter pain medication regularly for the first few weeks.  Choose one of the following that works best for you: °i. Naproxen (Aleve, etc)  Two 220mg tabs twice a day °ii. Ibuprofen (Advil, etc) Three 200mg tabs four times a day (every meal & bedtime) °iii. Acetaminophen (Tylenol, etc) 325-650mg four times a day (every meal & bedtime) °d. A  prescription for pain medication should be given to you upon discharge.  Take your pain medication as prescribed.  °i. If you are having problems/concerns with the prescription medicine (does not control pain, nausea, vomiting, rash, itching, etc), please call us [phone] to see if we need to switch you to a different pain medicine that will work better for you and/or control your side effect better. °ii. If you need a refill on your pain medication, please contact your pharmacy.  They will contact our office to request authorization. Prescriptions will not be filled after 5 pm or on week-ends. °4. Avoid getting constipated.  Between the surgery and the pain medications, it is common to experience some constipation.  Increasing fluid intake and taking a fiber supplement (such as Metamucil, Citrucel, FiberCon, MiraLax, etc) 1-2 times a day regularly will usually help prevent this problem from occurring.  A mild laxative (prune juice, Milk of Magnesia, MiraLax, etc) should be taken according to package directions if there are no bowel movements after 48 hours.   °5. Wash / shower every day.  You may shower over the dressings as they are waterproof.   °6. Remove   your waterproof bandages 5 days after surgery.  You may leave the incision open to air.  You may replace a dressing/Band-Aid to cover the incision for comfort if you wish.  Continue to shower over incision(s) after the dressing is off. ° ° ° °7. ACTIVITIES as tolerated:   °a. You may resume regular (light) daily activities beginning the next day--such  as daily self-care, walking, climbing stairs--gradually increasing activities as tolerated.  If you can walk 30 minutes without difficulty, it is safe to try more intense activity such as jogging, treadmill, bicycling, low-impact aerobics, swimming, etc. °b. Save the most intensive and strenuous activity for last such as sit-ups, heavy lifting, contact sports, etc  Refrain from any heavy lifting or straining until you are off narcotics for pain control.   °c. DO NOT PUSH THROUGH PAIN.  Let pain be your guide: If it hurts to do something, don't do it.  Pain is your body warning you to avoid that activity for another week until the pain goes down. °d. You may drive when you are no longer taking prescription pain medication, you can comfortably wear a seatbelt, and you can safely maneuver your car and apply brakes. °e. You may have sexual intercourse when it is comfortable.  °8. FOLLOW UP in our office °a. Please call [facility] at [phone] to set up an appointment to see your surgeon in the office for a follow-up appointment approximately 2-3 weeks after your surgery. °b. Make sure that you call for this appointment the day you arrive home to insure a convenient appointment time. °9.  IF YOU HAVE DISABILITY OR FAMILY LEAVE FORMS, BRING THEM TO THE OFFICE FOR PROCESSING.  DO NOT GIVE THEM TO YOUR DOCTOR. ° °WHEN TO CALL US [phone]: °1. Poor pain control °2. Reactions / problems with new medications (rash/itching, nausea, etc)  °3. Fever over 101.5 F (38.5 C) °4. Inability to urinate °5. Nausea and/or vomiting °6. Worsening swelling or bruising °7. Continued bleeding from incision. °8. Increased pain, redness, or drainage from the incision ° ° The clinic staff is available to answer your questions during regular business hours (8:30am-5pm).  Please don’t hesitate to call and ask to speak to one of our nurses for clinical concerns.  ° If you have a medical emergency, go to the nearest emergency room or call  911. ° A surgeon from [facility] is always on call at the hospitals in [place] ° °[facility] °[address], [address], [place], [place]  [address] ? ° [address] [address], [place], [place]   [address] °MAIN: [phone] ? TOLL FREE: [phone] ? FAX: [phone] °[email or web address] ° ° °

## 2017-01-25 NOTE — Anesthesia Procedure Notes (Signed)
Anesthesia Regional Block: TAP block   Pre-Anesthetic Checklist: ,, timeout performed, Correct Patient, Correct Site, Correct Laterality, Correct Procedure, Correct Position, site marked, Risks and benefits discussed,  Surgical consent,  Pre-op evaluation,  At surgeon's request and post-op pain management  Laterality: Right  Prep: chloraprep       Needles:  Injection technique: Single-shot  Needle Type: Echogenic Stimulator Needle      Needle Gauge: 22     Additional Needles:   Procedures: ultrasound guided,,,,,,,,  Narrative:  Start time: 01/25/2017 7:20 AM End time: 01/25/2017 7:30 AM Injection made incrementally with aspirations every 5 mL.  Performed by: Personally   Additional Notes: 30 cc 0.5% Bupivacaine with 1:200 epi injected easily

## 2017-01-25 NOTE — Anesthesia Procedure Notes (Signed)
Procedure Name: Intubation Date/Time: 01/25/2017 7:40 AM Performed by: Jenne Campus Pre-anesthesia Checklist: Patient identified, Emergency Drugs available, Suction available and Patient being monitored Patient Re-evaluated:Patient Re-evaluated prior to induction Oxygen Delivery Method: Circle System Utilized Preoxygenation: Pre-oxygenation with 100% oxygen Induction Type: IV induction Ventilation: Mask ventilation without difficulty Laryngoscope Size: Miller and 3 Grade View: Grade II Tube type: Oral Tube size: 7.5 mm Number of attempts: 1 Airway Equipment and Method: Stylet and Oral airway Placement Confirmation: ETT inserted through vocal cords under direct vision,  positive ETCO2 and breath sounds checked- equal and bilateral Secured at: 22 cm Tube secured with: Tape Dental Injury: Teeth and Oropharynx as per pre-operative assessment

## 2017-01-25 NOTE — Anesthesia Preprocedure Evaluation (Addendum)
Anesthesia Evaluation  Patient identified by MRN, date of birth, ID band Patient awake    Reviewed: Allergy & Precautions, NPO status , Patient's Chart, lab work & pertinent test results  Airway Mallampati: II  TM Distance: >3 FB Neck ROM: Full    Dental  (+) Teeth Intact, Dental Advisory Given   Pulmonary sleep apnea , former smoker,    breath sounds clear to auscultation       Cardiovascular hypertension, Pt. on medications  Rhythm:Regular Rate:Normal     Neuro/Psych    GI/Hepatic Neg liver ROS, GERD  Medicated and Controlled,  Endo/Other    Renal/GU negative Renal ROS     Musculoskeletal   Abdominal (+) - obese,   Peds  Hematology   Anesthesia Other Findings   Reproductive/Obstetrics negative OB ROS                            Anesthesia Physical Anesthesia Plan  ASA: III  Anesthesia Plan: General   Post-op Pain Management:  Regional for Post-op pain   Induction: Intravenous  PONV Risk Score and Plan: Ondansetron and Dexamethasone  Airway Management Planned: Oral ETT  Additional Equipment:   Intra-op Plan:   Post-operative Plan: Extubation in OR  Informed Consent: I have reviewed the patients History and Physical, chart, labs and discussed the procedure including the risks, benefits and alternatives for the proposed anesthesia with the patient or authorized representative who has indicated his/her understanding and acceptance.   Dental advisory given  Plan Discussed with: Anesthesiologist and CRNA  Anesthesia Plan Comments:         Anesthesia Quick Evaluation

## 2017-01-25 NOTE — Op Note (Signed)
Operative Note  Allen Pierce  [id number]  [id number]  01/25/2017   Surgeon: Clovis Riley, MD  Assistant: Fanny Skates, MD  Procedure performed: Open repair of indirect recurrent inguinal hernia with ultrapro mesh  Preop diagnosis:  right inguinal hernia, recurrent s/p laparoscopic repair march 2018  Post-op diagnosis/intraop findings: reducible indirect recurrence, cord lipoma  Specimens: cord lipoma  EBL: 94RD  Complications: none  Description of procedure: After obtaining informed consent and placement of a TAPS block by anesthesia in holding, the patient was taken to the operating room and placed supine on operating room table wheregeneral endotracheal anesthesia was initiated, preoperative antibiotics were administered, SCDs applied, and a formal timeout was performed. The right groin was clipped and prepped and draped in the usual sterile fashion. An oblique incision was made in the right groin after infiltrating the tissues with local anesthetic. Soft tissues were dissected using electrocautery until the external oblique aponeurosis was encountered. This was divided sharply to expand the external ring. A plane was bluntly developed between the spermatic cord and the external oblique, exposing the inguinal ligament and shelving edge. The ilioinguinal nerve was visualized and divided between hemostats. The spermatic cord was then bluntly dissected away from the pubic tubercle and encircled with a Penrose. Inspection of the inguinal anatomy revealed a moderate cord lipoma and an indirect recurrence. The lipoma and  indirect hernia sac were bluntly dissected away from the cord structures.  The lipoma was followed up to the level of the internal ring, where it was dissected down to a stalk which was thinned out and divided piecemeal between hemostats and using 3-0 vicryl ties the staying sides were ligated. The indirect sac was easily reducible, and the prior mesh was palpable  at the anterior aspect of the sac/ posterior aspect of the cord lipoma. The sac was reduced and 3-0 vicryl suture were employed to narrow the internal ring over the hernia sac, keeping it out of the field. A 3 x 6 piece of ultra Pro mesh was brought onto the field and trimmed minimally to approximate the field. This was tacked to the pubic tubercle fascia using 0 ethibond. [place] 0 ethibond was then used to tack the mesh to the inferior shelving edge. Superiorly the mesh was tacked to the internal oblique using interrupted 0 ethibonds. The tails of the mesh were wrapped around the spermatic cord, ensuring adequate room for the cord,and tacked to each other with 0 ethibond, and then directed laterally to lie flat. Hemostasis was ensured within the wound. The Penrose was removed. We inspected our repair. There was good apposition of the mesh to the inguinal ligament and shelving edge with plenty of overlap beyond the pubic tubercle medially and circumferentially in the field. There was a small amount of laxity in the mesh to allow for mesh contraction without undue tension. The external oblique aponeurosis was reapproximated with a running 3-0 Vicryl to re-create a narrowed external ring. The Scarpa's was reapproximated with interrupted 3-0 Vicryls. The skin was closed with a running subcuticular Monocryl. The field was then cleaned, benzoin and Steri-Strips and sterile bandage were applied. Both testicles were palpated in the scrotum at the end of the case. The patient was then awakened extubated and taken to PACU in stable condition.   All counts were correct at the completion of the case

## 2017-01-25 NOTE — Anesthesia Postprocedure Evaluation (Signed)
Anesthesia Post Note  Patient: Allen Pierce  Procedure(s) Performed: Procedure(s) (LRB): RIGHT INGUINAL HERNIA REPAIR WITH MESH (Right) INSERTION OF MESH (Right)     Patient location during evaluation: PACU Anesthesia Type: General Level of consciousness: awake, awake and alert and oriented Pain management: pain level controlled Vital Signs Assessment: post-procedure vital signs reviewed and stable Respiratory status: spontaneous breathing, nonlabored ventilation and respiratory function stable Cardiovascular status: blood pressure returned to baseline Anesthetic complications: no    Last Vitals:  Vitals:   01/25/17 1015 01/25/17 1055  BP: 140/84 (!) 161/88  Pulse: 60 (!) 54  Resp: 18   Temp:    SpO2: 95% 92%    Last Pain:  Vitals:   01/25/17 1015  TempSrc:   PainSc: 4                  Page Pucciarelli COKER

## 2017-01-28 ENCOUNTER — Encounter (HOSPITAL_COMMUNITY): Payer: Self-pay | Admitting: Surgery

## 2017-02-19 ENCOUNTER — Ambulatory Visit (HOSPITAL_BASED_OUTPATIENT_CLINIC_OR_DEPARTMENT_OTHER): Admission: RE | Admit: 2017-02-19 | Payer: PPO | Source: Ambulatory Visit | Admitting: Urology

## 2017-02-19 ENCOUNTER — Encounter (HOSPITAL_BASED_OUTPATIENT_CLINIC_OR_DEPARTMENT_OTHER): Admission: RE | Payer: Self-pay | Source: Ambulatory Visit

## 2017-02-19 SURGERY — CYSTOSCOPY/URETEROSCOPY/HOLMIUM LASER/STENT PLACEMENT
Anesthesia: Choice | Laterality: Right

## 2017-03-13 DIAGNOSIS — Z23 Encounter for immunization: Secondary | ICD-10-CM | POA: Diagnosis not present

## 2017-04-08 DIAGNOSIS — X32XXXD Exposure to sunlight, subsequent encounter: Secondary | ICD-10-CM | POA: Diagnosis not present

## 2017-04-08 DIAGNOSIS — C44212 Basal cell carcinoma of skin of right ear and external auricular canal: Secondary | ICD-10-CM | POA: Diagnosis not present

## 2017-04-08 DIAGNOSIS — L570 Actinic keratosis: Secondary | ICD-10-CM | POA: Diagnosis not present

## 2017-05-16 DIAGNOSIS — E782 Mixed hyperlipidemia: Secondary | ICD-10-CM | POA: Diagnosis not present

## 2017-05-20 DIAGNOSIS — G8929 Other chronic pain: Secondary | ICD-10-CM | POA: Diagnosis not present

## 2017-05-22 DIAGNOSIS — Z85828 Personal history of other malignant neoplasm of skin: Secondary | ICD-10-CM | POA: Diagnosis not present

## 2017-05-22 DIAGNOSIS — Z08 Encounter for follow-up examination after completed treatment for malignant neoplasm: Secondary | ICD-10-CM | POA: Diagnosis not present

## 2017-06-20 IMAGING — DX DG CHEST 2V
2 series · 2 of 2 positions shown · non-contrast
Comparison: None.

CLINICAL DATA: Preop inguinal hernia repair

EXAM:
CHEST  2 VIEW

[chest pa]
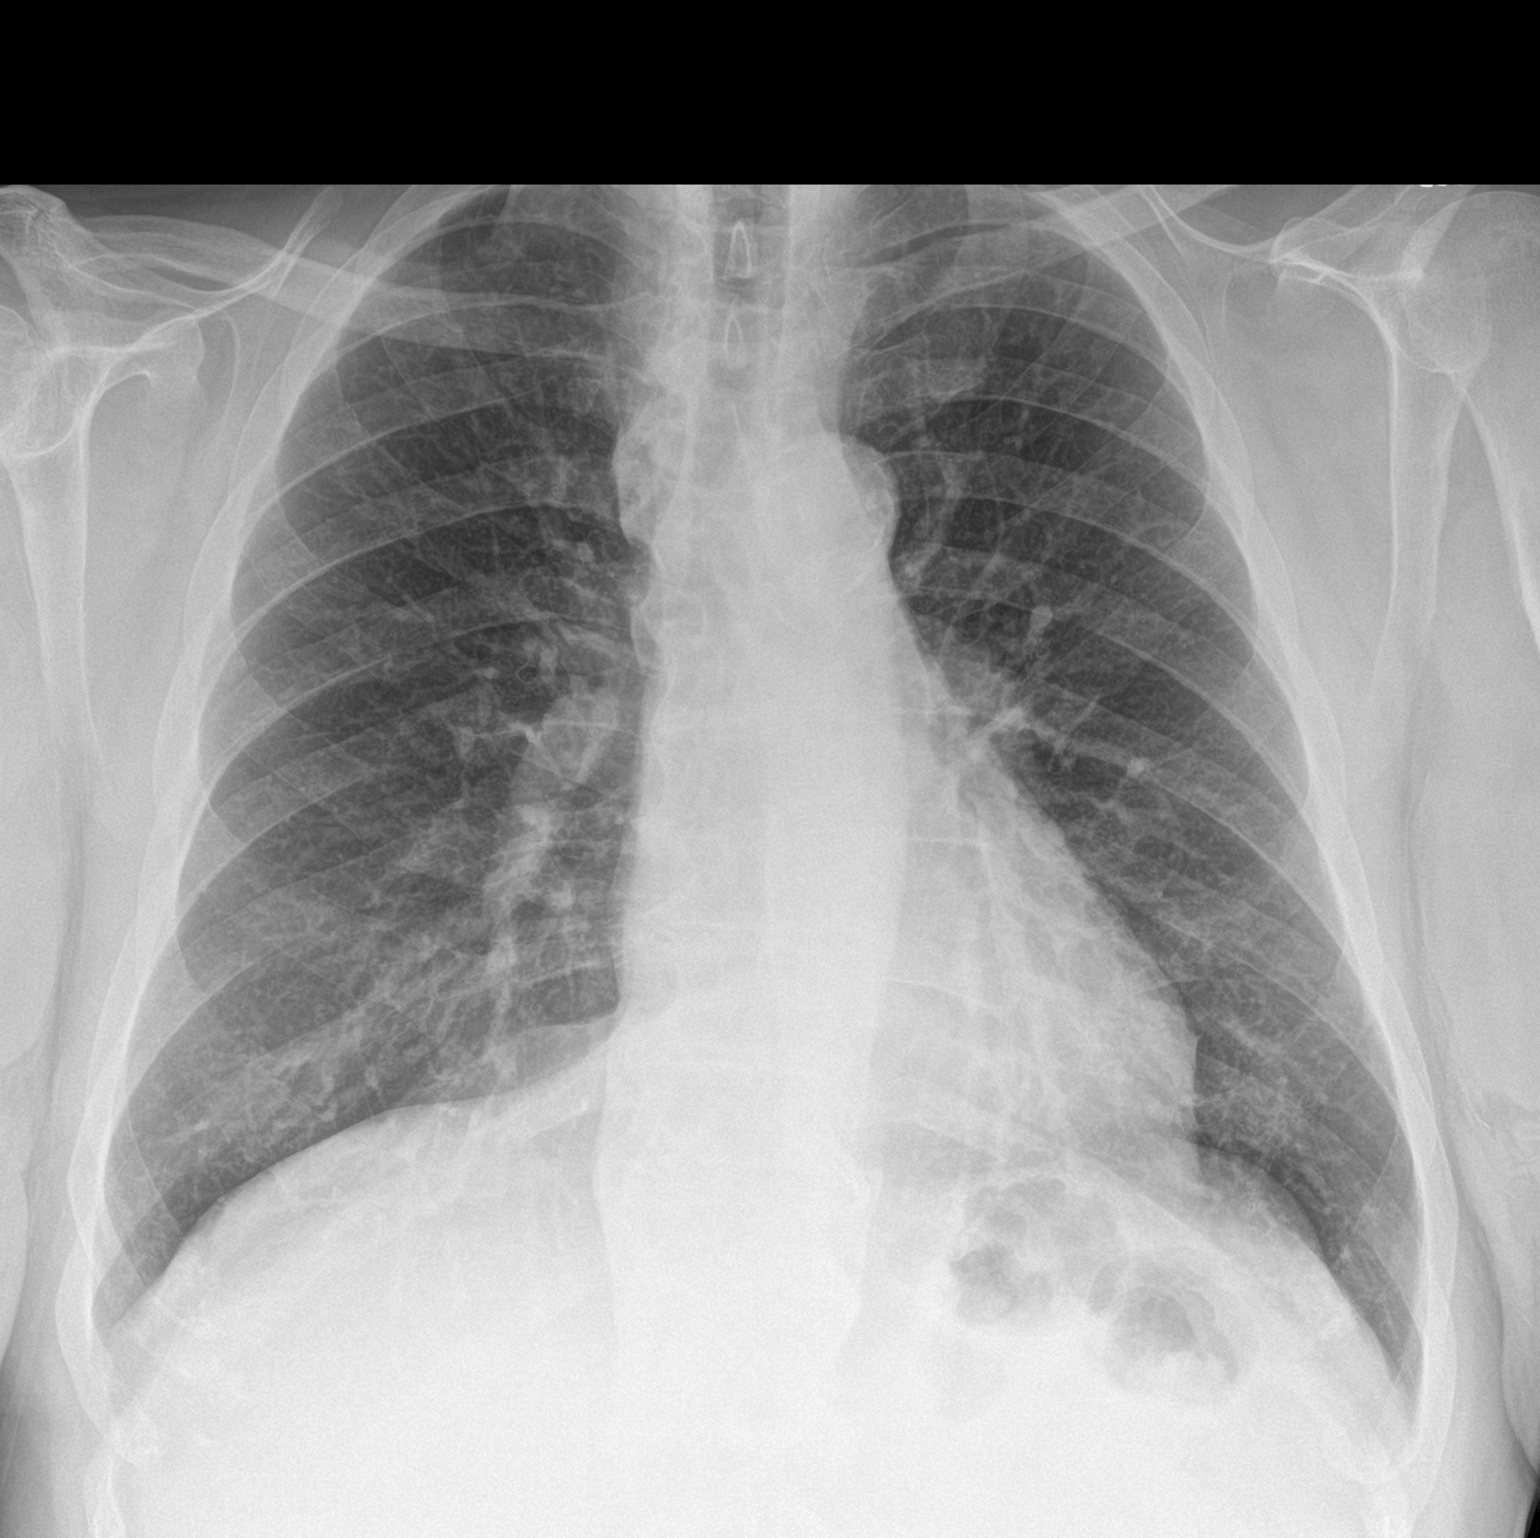

[chest lat]
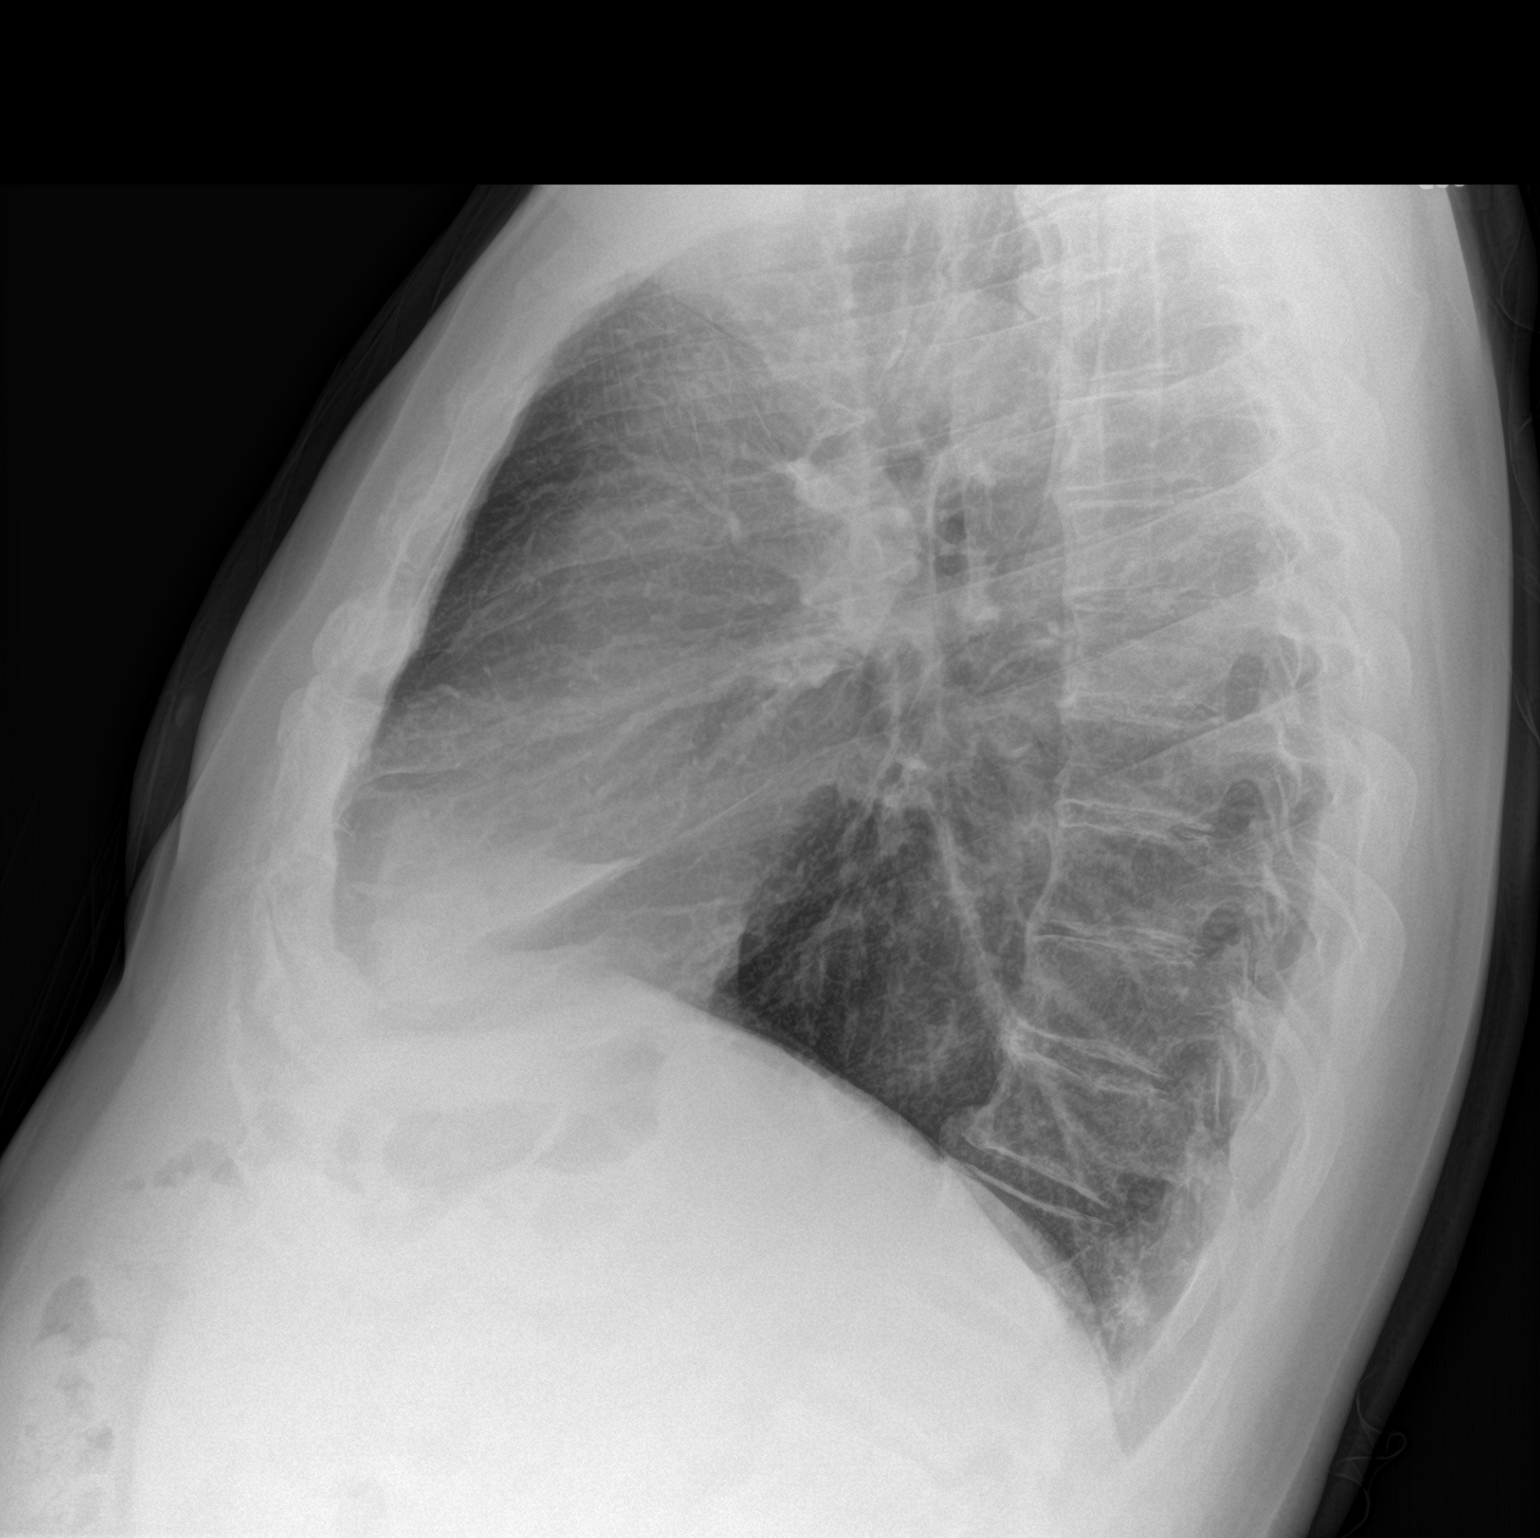

[2 of 2 positions shown; findings below may reference images not displayed]

FINDINGS: Lungs are clear.  No pleural effusion or pneumothorax.

Prominent epicardial fat along the right heart border, overlying the
heart on the lateral view.

The heart is normal in size.

Degenerative changes of the visualized thoracolumbar spine.
IMPRESSION: No evidence of acute cardiopulmonary disease.

## 2018-01-20 DIAGNOSIS — E782 Mixed hyperlipidemia: Secondary | ICD-10-CM | POA: Diagnosis not present

## 2018-01-20 DIAGNOSIS — Z79899 Other long term (current) drug therapy: Secondary | ICD-10-CM | POA: Diagnosis not present

## 2018-01-20 DIAGNOSIS — I10 Essential (primary) hypertension: Secondary | ICD-10-CM | POA: Diagnosis not present

## 2018-01-20 DIAGNOSIS — Z125 Encounter for screening for malignant neoplasm of prostate: Secondary | ICD-10-CM | POA: Diagnosis not present

## 2018-01-22 DIAGNOSIS — K219 Gastro-esophageal reflux disease without esophagitis: Secondary | ICD-10-CM | POA: Diagnosis not present

## 2018-01-22 DIAGNOSIS — D72829 Elevated white blood cell count, unspecified: Secondary | ICD-10-CM | POA: Diagnosis not present

## 2018-01-22 DIAGNOSIS — Z0000 Encounter for general adult medical examination without abnormal findings: Secondary | ICD-10-CM | POA: Diagnosis not present

## 2018-01-22 DIAGNOSIS — M4807 Spinal stenosis, lumbosacral region: Secondary | ICD-10-CM | POA: Diagnosis not present

## 2018-01-22 DIAGNOSIS — R7303 Prediabetes: Secondary | ICD-10-CM | POA: Diagnosis not present

## 2018-01-22 DIAGNOSIS — M109 Gout, unspecified: Secondary | ICD-10-CM | POA: Diagnosis not present

## 2018-01-22 DIAGNOSIS — M5430 Sciatica, unspecified side: Secondary | ICD-10-CM | POA: Diagnosis not present

## 2018-01-22 DIAGNOSIS — I10 Essential (primary) hypertension: Secondary | ICD-10-CM | POA: Diagnosis not present

## 2018-01-22 DIAGNOSIS — Z7982 Long term (current) use of aspirin: Secondary | ICD-10-CM | POA: Diagnosis not present

## 2018-01-22 DIAGNOSIS — Z6830 Body mass index (BMI) 30.0-30.9, adult: Secondary | ICD-10-CM | POA: Diagnosis not present

## 2018-01-22 DIAGNOSIS — R3129 Other microscopic hematuria: Secondary | ICD-10-CM | POA: Diagnosis not present

## 2018-01-22 DIAGNOSIS — E782 Mixed hyperlipidemia: Secondary | ICD-10-CM | POA: Diagnosis not present

## 2018-02-06 DIAGNOSIS — L570 Actinic keratosis: Secondary | ICD-10-CM | POA: Diagnosis not present

## 2018-02-06 DIAGNOSIS — L918 Other hypertrophic disorders of the skin: Secondary | ICD-10-CM | POA: Diagnosis not present

## 2018-02-06 DIAGNOSIS — X32XXXD Exposure to sunlight, subsequent encounter: Secondary | ICD-10-CM | POA: Diagnosis not present

## 2018-03-24 ENCOUNTER — Encounter: Payer: Self-pay | Admitting: *Deleted

## 2018-03-25 ENCOUNTER — Ambulatory Visit: Payer: PPO | Admitting: Diagnostic Neuroimaging

## 2018-03-25 ENCOUNTER — Encounter: Payer: Self-pay | Admitting: Diagnostic Neuroimaging

## 2018-03-25 VITALS — BP 160/79 | HR 51 | Ht 73.0 in | Wt 234.0 lb

## 2018-03-25 DIAGNOSIS — R200 Anesthesia of skin: Secondary | ICD-10-CM | POA: Diagnosis not present

## 2018-03-25 NOTE — Progress Notes (Signed)
[facility]  PATIENT: Allen Pierce DOB: 10-15-1940  REFERRING CLINICIAN: pharr, w  HISTORY FROM: patient  REASON FOR VISIT: new consult    HISTORICAL  CHIEF COMPLAINT:  Chief Complaint  Patient presents with  . Numbness    rm 7, New Pt, "numbness in my feet at times x 3-4 months, especially at night when I go to bed"    HISTORY OF PRESENT ILLNESS:   77 year old male here for evaluation of numbness and tingling.  History of hypertension and skin cancer.  3 to 4 months the patient noticed intermittent numbness and tingling in his toes.  Symptoms are worse at nighttime when he lays down to go to sleep.  Symptoms are fairly noticeable during the daytime.  No progression of symptoms over the last few months.  No symptoms in his fingers or hands.  No vision changes, slurred speech or trouble talking.  No headaches.  Patient does have history of low back pain, across the bottom lower part of his back.  No radiating symptoms into his legs.  He previously took pain medications for this but symptoms seem to have spontaneously improved and he is not taking pain medicines in the last 5 months.  No other specific aggravating or triggering factors.    REVIEW OF SYSTEMS: Full 14 system review of systems performed and negative with exception of: Fatigue swelling in legs snoring hearing loss ringing in ears itching numbness dizziness.  ALLERGIES: No Known Allergies  HOME MEDICATIONS: Outpatient Medications Prior to Visit  Medication Sig Dispense Refill  . allopurinol (ZYLOPRIM) 300 MG tablet Take 300 mg by mouth daily.    Marland Kitchen amLODipine (NORVASC) 5 MG tablet Take 5 mg by mouth daily.    Marland Kitchen aspirin EC 81 MG tablet Take 81 mg by mouth every evening.    Mariane Baumgarten Calcium (STOOL SOFTENER PO) Take 1 tablet by mouth 2 (two) times daily.    . metoprolol (LOPRESSOR) 50 MG tablet Take 50 mg by mouth 2 (two) times daily.    . naproxen sodium (ANAPROX) 220 MG tablet Take 220 mg  by mouth 2 (two) times daily as needed.     . potassium chloride (K-DUR,KLOR-CON) 10 MEQ tablet Take 10 mEq by mouth every other day.    . ranitidine (ZANTAC) 150 MG tablet Take 1 tablet by mouth daily as needed (heartburn).    . rosuvastatin (CRESTOR) 10 MG tablet Take 20 mg by mouth every other day.     . oxyCODONE-acetaminophen (PERCOCET/ROXICET) 5-325 MG tablet Take 1 tablet by mouth every 6 (six) hours as needed for severe pain. (Patient not taking: Reported on 03/25/2018) 30 tablet 0   No facility-administered medications prior to visit.     PAST MEDICAL HISTORY: Past Medical History:  Diagnosis Date  . A-fib ([place])   . AP (abdominal pain)   . Back pain    bad disc  . Dysrhythmia   . GERD (gastroesophageal reflux disease)   . Hemorrhoids   . History of hiatal hernia   . History of kidney stones   . HLD (hyperlipidemia)   . HTN (hypertension)   . Melanoma ([place])    skin cancer ears  . Right inguinal hernia   . Sleep apnea    discontinued 3-4 years ago  no longer wears a mask    PAST SURGICAL HISTORY: Past Surgical History:  Procedure Laterality Date  . COLONOSCOPY     x 2 along with endoscopy each time   . DIAGNOSTIC  LAPAROSCOPY    . HERNIA REPAIR     Right  . INGUINAL HERNIA REPAIR Right 08/17/2016   Procedure: LAPAROSCOPIC RIGHT INGUINAL HERNIA REPAIR;  Surgeon: Clovis Riley, MD;  Location: [facility];  Service: General;  Laterality: Right;  . INGUINAL HERNIA REPAIR Right 01/25/2017   Procedure: RIGHT INGUINAL HERNIA REPAIR WITH MESH;  Surgeon: Clovis Riley, MD;  Location: [place];  Service: General;  Laterality: Right;  . INSERTION OF MESH Right 01/25/2017   Procedure: INSERTION OF MESH;  Surgeon: Clovis Riley, MD;  Location: [place];  Service: General;  Laterality: Right;  . LAPAROSCOPIC REMOVAL OF MESENTERIC MASS N/A 08/17/2016   Procedure: BIOPSY OF MESENTERIC MASS;  Surgeon: Clovis Riley, MD;  Location: [facility];  Service: General;  Laterality: N/A;     FAMILY HISTORY: Family History  Problem Relation Age of Onset  . Hypertension Mother   . CVA Father   . Hypertension Father   . Heart attack Father   . Diabetes Brother   . Hypertension Brother   . Colon polyps Brother   . Melanoma Brother   . Stroke Brother   . Cancer Brother        kidney    SOCIAL HISTORY: Social History   Socioeconomic History  . Marital status: Married    Spouse name: Presenter, broadcasting  . Number of children: 0  . Years of education: college grad  . Highest education level: Not on file  Occupational History    Comment: pastor, retired  Scientific laboratory technician  . Financial resource strain: Not on file  . Food insecurity:    Worry: Not on file    Inability: Not on file  . Transportation needs:    Medical: Not on file    Non-medical: Not on file  Tobacco Use  . Smoking status: Former Smoker    Types: Cigarettes  . Smokeless tobacco: Never Used  . Tobacco comment: 1-2 years in high school  Substance and Sexual Activity  . Alcohol use: Never    Frequency: Never  . Drug use: Never  . Sexual activity: Yes  Lifestyle  . Physical activity:    Days per week: Not on file    Minutes per session: Not on file  . Stress: Not on file  Relationships  . Social connections:    Talks on phone: Not on file    Gets together: Not on file    Attends religious service: Not on file    Active member of club or organization: Not on file    Attends meetings of clubs or organizations: Not on file    Relationship status: Not on file  . Intimate partner violence:    Fear of current or ex partner: Not on file    Emotionally abused: Not on file    Physically abused: Not on file    Forced sexual activity: Not on file  Other Topics Concern  . Not on file  Social History Narrative   Lives with wife   Caffeine- coffee 1 cup daily     PHYSICAL EXAM  GENERAL EXAM/CONSTITUTIONAL: Vitals:  Vitals:   03/25/18 1240  BP: (!) 160/79  Pulse: (!) 51  Weight: 234 lb (106.1 kg)   Height: _0  (1.854 m)     Body mass index is 30.87 kg/m. Wt Readings from Last 3 Encounters:  03/25/18 234 lb (106.1 kg)  01/25/17 234 lb (106.1 kg)  01/23/17 234 lb 8 oz (106.4 kg)  Patient is in no distress; well developed, nourished and groomed; neck is supple  CARDIOVASCULAR:  Examination of carotid arteries is normal; no carotid bruits  Regular rate and rhythm, no murmurs  Examination of peripheral vascular system by observation and palpation is normal  EYES:  Ophthalmoscopic exam of optic discs and posterior segments is normal; no papilledema or hemorrhages  Visual Acuity Screening   Right eye Left eye Both eyes  Without correction:     With correction: 20/30 20/30      MUSCULOSKELETAL:  Gait, strength, tone, movements noted in Neurologic exam below  NEUROLOGIC: MENTAL STATUS:  No flowsheet data found.  awake, alert, oriented to person, place and time  recent and remote memory intact  normal attention and concentration  language fluent, comprehension intact, naming intact  fund of knowledge appropriate  CRANIAL NERVE:   2nd - no papilledema on fundoscopic exam  2nd, 3rd, 4th, 6th - pupils equal and reactive to light, visual fields full to confrontation, extraocular muscles intact, no nystagmus  5th - facial sensation symmetric  7th - facial strength symmetric  8th - hearing intact  9th - palate elevates symmetrically, uvula midline  11th - shoulder shrug symmetric  12th - tongue protrusion midline  MOTOR:   normal bulk and tone, full strength in the BUE, BLE  SENSORY:   normal and symmetric to light touch, pinprick, temperature, vibration; EXCEPT ABSENT PP AND VIB AT TOES AND ANKLES  COORDINATION:   finger-nose-finger, fine finger movements normal  REFLEXES:   deep tendon reflexes TRACE and symmetric; ABSENT IN LOWER EXT  GAIT/STATION:   narrow based gait; ROMBERG NEG; DIFF WITH TOE AND HEEL GAIT     DIAGNOSTIC  DATA (LABS, IMAGING, TESTING) - I reviewed patient records, labs, notes, testing and imaging myself where available.  Lab Results  Component Value Date   WBC 9.0 01/23/2017   HGB 13.8 01/23/2017   HCT 41.0 01/23/2017   MCV 86.5 01/23/2017   PLT 246 01/23/2017      Component Value Date/Time   NA 140 01/23/2017 1043   K 3.3 (L) 01/23/2017 1043   CL 104 01/23/2017 1043   CO2 28 01/23/2017 1043   GLUCOSE 114 (H) 01/23/2017 1043   BUN 12 01/23/2017 1043   CREATININE 1.09 01/23/2017 1043   CALCIUM 9.2 01/23/2017 1043   PROT 7.0 07/06/2016 1317   ALBUMIN 4.1 07/06/2016 1317   AST 18 07/06/2016 1317   ALT 14 (L) 07/06/2016 1317   ALKPHOS 51 07/06/2016 1317   BILITOT 0.8 07/06/2016 1317   GFRNONAA >60 01/23/2017 1043   GFRAA >60 01/23/2017 1043   No results found for: CHOL, HDL, LDLCALC, LDLDIRECT, TRIG, CHOLHDL No results found for: HGBA1C No results found for: VITAMINB12 No results found for: TSH      ASSESSMENT AND PLAN  77 y.o. year old male here with new onset of mild numbness and tingling in his toes, worse at nighttime, starting in August 2019.  Neurologic examination notable for decreased sensation in the toes and feet with decreased reflexes at the ankles and knees.  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  We will proceed with further work-up.   Dx: neuropathy  1. Numbness      PLAN:  - check neuropathy labs - monitor symptoms - may consider EMG/NCS (CIDP evaluation) in future - caution with balance - no pain at this time fortunately; hold off on neuropathic pain medications for now  Orders Placed This Encounter  Procedures  . Vitamin B12  . TSH  . Hemoglobin A1c  . Multiple Myeloma Panel (SPEP&IFE w/QIG)  . Angiotensin converting enzyme   Return pending test results and symptoms.    Penni Bombard, MD [id number]/4627, 03:50 PM Certified in Neurology, Neurophysiology and Neuroimaging  [facility] [address] [place], [place] [address] [phone]

## 2018-03-25 NOTE — Patient Instructions (Signed)
-   neuropathy labs  - monitor symptoms  - caution with balance

## 2018-03-26 ENCOUNTER — Telehealth: Payer: Self-pay | Admitting: *Deleted

## 2018-03-26 NOTE — Telephone Encounter (Signed)
Spoke with patient and informed him that his lab showed a low B12 which may cause numbness. Advised ihm Dr Leta Baptist recommends he start Vitamin B12 1015mcg oral daily, and follow up with his PCP. Advised him his other labs are pending, and he will get a call when those results are in. He verbalized understanding, appreciation.

## 2018-03-29 LAB — TSH: TSH: 1.58 u[IU]/mL (ref 0.450–4.500)

## 2018-03-29 LAB — HEMOGLOBIN A1C
ESTIMATED AVERAGE GLUCOSE: 126 mg/dL
Hgb A1c MFr Bld: 6 % — ABNORMAL HIGH (ref 4.8–5.6)

## 2018-03-29 LAB — MULTIPLE MYELOMA PANEL, SERUM
ALBUMIN SERPL ELPH-MCNC: 3.9 g/dL (ref 2.9–4.4)
ALBUMIN/GLOB SERPL: 1.4 (ref 0.7–1.7)
ALPHA2 GLOB SERPL ELPH-MCNC: 0.8 g/dL (ref 0.4–1.0)
Alpha 1: 0.2 g/dL (ref 0.0–0.4)
B-GLOBULIN SERPL ELPH-MCNC: 1.2 g/dL (ref 0.7–1.3)
Gamma Glob SerPl Elph-Mcnc: 0.7 g/dL (ref 0.4–1.8)
Globulin, Total: 2.8 g/dL (ref 2.2–3.9)
IGA/IMMUNOGLOBULIN A, SERUM: 447 mg/dL — AB (ref 61–437)
IGG (IMMUNOGLOBIN G), SERUM: 817 mg/dL (ref 700–1600)
IgM (Immunoglobulin M), Srm: 46 mg/dL (ref 15–143)
Total Protein: 6.7 g/dL (ref 6.0–8.5)

## 2018-03-29 LAB — VITAMIN B12: VITAMIN B 12: 182 pg/mL — AB (ref 232–1245)

## 2018-03-29 LAB — ANGIOTENSIN CONVERTING ENZYME: Angio Convert Enzyme: 35 U/L (ref 14–82)

## 2019-02-10 DIAGNOSIS — I10 Essential (primary) hypertension: Secondary | ICD-10-CM | POA: Diagnosis not present

## 2019-02-10 DIAGNOSIS — Z125 Encounter for screening for malignant neoplasm of prostate: Secondary | ICD-10-CM | POA: Diagnosis not present

## 2019-02-10 DIAGNOSIS — E782 Mixed hyperlipidemia: Secondary | ICD-10-CM | POA: Diagnosis not present

## 2019-02-10 DIAGNOSIS — Z1212 Encounter for screening for malignant neoplasm of rectum: Secondary | ICD-10-CM | POA: Diagnosis not present

## 2019-02-16 DIAGNOSIS — Z0000 Encounter for general adult medical examination without abnormal findings: Secondary | ICD-10-CM | POA: Diagnosis not present

## 2019-02-16 DIAGNOSIS — N401 Enlarged prostate with lower urinary tract symptoms: Secondary | ICD-10-CM | POA: Diagnosis not present

## 2019-02-16 DIAGNOSIS — Z6829 Body mass index (BMI) 29.0-29.9, adult: Secondary | ICD-10-CM | POA: Diagnosis not present

## 2019-02-16 DIAGNOSIS — E782 Mixed hyperlipidemia: Secondary | ICD-10-CM | POA: Diagnosis not present

## 2019-02-16 DIAGNOSIS — I10 Essential (primary) hypertension: Secondary | ICD-10-CM | POA: Diagnosis not present

## 2019-02-16 DIAGNOSIS — M109 Gout, unspecified: Secondary | ICD-10-CM | POA: Diagnosis not present

## 2019-02-16 DIAGNOSIS — Z87891 Personal history of nicotine dependence: Secondary | ICD-10-CM | POA: Diagnosis not present

## 2019-02-16 DIAGNOSIS — R350 Frequency of micturition: Secondary | ICD-10-CM | POA: Diagnosis not present

## 2019-02-16 DIAGNOSIS — K219 Gastro-esophageal reflux disease without esophagitis: Secondary | ICD-10-CM | POA: Diagnosis not present

## 2019-02-16 DIAGNOSIS — K648 Other hemorrhoids: Secondary | ICD-10-CM | POA: Diagnosis not present

## 2019-02-16 DIAGNOSIS — R079 Chest pain, unspecified: Secondary | ICD-10-CM | POA: Diagnosis not present

## 2019-02-16 DIAGNOSIS — G629 Polyneuropathy, unspecified: Secondary | ICD-10-CM | POA: Diagnosis not present

## 2019-03-17 DIAGNOSIS — I10 Essential (primary) hypertension: Secondary | ICD-10-CM | POA: Diagnosis not present

## 2019-03-17 DIAGNOSIS — R079 Chest pain, unspecified: Secondary | ICD-10-CM | POA: Diagnosis not present

## 2019-03-17 DIAGNOSIS — N401 Enlarged prostate with lower urinary tract symptoms: Secondary | ICD-10-CM | POA: Diagnosis not present

## 2019-03-17 DIAGNOSIS — Z23 Encounter for immunization: Secondary | ICD-10-CM | POA: Diagnosis not present

## 2019-04-02 DIAGNOSIS — X32XXXD Exposure to sunlight, subsequent encounter: Secondary | ICD-10-CM | POA: Diagnosis not present

## 2019-04-02 DIAGNOSIS — L570 Actinic keratosis: Secondary | ICD-10-CM | POA: Diagnosis not present

## 2019-04-02 DIAGNOSIS — H61002 Unspecified perichondritis of left external ear: Secondary | ICD-10-CM | POA: Diagnosis not present

## 2019-04-02 DIAGNOSIS — C44319 Basal cell carcinoma of skin of other parts of face: Secondary | ICD-10-CM | POA: Diagnosis not present

## 2019-04-09 DIAGNOSIS — C44319 Basal cell carcinoma of skin of other parts of face: Secondary | ICD-10-CM | POA: Diagnosis not present

## 2019-04-09 DIAGNOSIS — X32XXXD Exposure to sunlight, subsequent encounter: Secondary | ICD-10-CM | POA: Diagnosis not present

## 2019-04-09 DIAGNOSIS — L570 Actinic keratosis: Secondary | ICD-10-CM | POA: Diagnosis not present

## 2019-04-29 ENCOUNTER — Other Ambulatory Visit: Payer: Self-pay

## 2019-05-11 DIAGNOSIS — C4441 Basal cell carcinoma of skin of scalp and neck: Secondary | ICD-10-CM | POA: Diagnosis not present

## 2019-05-11 DIAGNOSIS — X32XXXD Exposure to sunlight, subsequent encounter: Secondary | ICD-10-CM | POA: Diagnosis not present

## 2019-05-11 DIAGNOSIS — L570 Actinic keratosis: Secondary | ICD-10-CM | POA: Diagnosis not present

## 2019-05-19 DIAGNOSIS — R42 Dizziness and giddiness: Secondary | ICD-10-CM | POA: Diagnosis not present

## 2019-05-19 DIAGNOSIS — R55 Syncope and collapse: Secondary | ICD-10-CM | POA: Diagnosis not present

## 2019-05-25 DIAGNOSIS — G629 Polyneuropathy, unspecified: Secondary | ICD-10-CM | POA: Diagnosis not present

## 2019-05-25 DIAGNOSIS — N401 Enlarged prostate with lower urinary tract symptoms: Secondary | ICD-10-CM | POA: Diagnosis not present

## 2019-05-25 DIAGNOSIS — R55 Syncope and collapse: Secondary | ICD-10-CM | POA: Diagnosis not present

## 2019-07-06 DIAGNOSIS — Z85828 Personal history of other malignant neoplasm of skin: Secondary | ICD-10-CM | POA: Diagnosis not present

## 2019-07-06 DIAGNOSIS — Z08 Encounter for follow-up examination after completed treatment for malignant neoplasm: Secondary | ICD-10-CM | POA: Diagnosis not present

## 2019-10-08 DIAGNOSIS — L570 Actinic keratosis: Secondary | ICD-10-CM | POA: Diagnosis not present

## 2019-10-08 DIAGNOSIS — C44219 Basal cell carcinoma of skin of left ear and external auricular canal: Secondary | ICD-10-CM | POA: Diagnosis not present

## 2019-10-08 DIAGNOSIS — D0439 Carcinoma in situ of skin of other parts of face: Secondary | ICD-10-CM | POA: Diagnosis not present

## 2019-10-08 DIAGNOSIS — X32XXXD Exposure to sunlight, subsequent encounter: Secondary | ICD-10-CM | POA: Diagnosis not present

## 2019-10-08 DIAGNOSIS — Z08 Encounter for follow-up examination after completed treatment for malignant neoplasm: Secondary | ICD-10-CM | POA: Diagnosis not present

## 2019-10-08 DIAGNOSIS — Z85828 Personal history of other malignant neoplasm of skin: Secondary | ICD-10-CM | POA: Diagnosis not present

## 2019-11-23 DIAGNOSIS — Z08 Encounter for follow-up examination after completed treatment for malignant neoplasm: Secondary | ICD-10-CM | POA: Diagnosis not present

## 2019-11-23 DIAGNOSIS — X32XXXD Exposure to sunlight, subsequent encounter: Secondary | ICD-10-CM | POA: Diagnosis not present

## 2019-11-23 DIAGNOSIS — D0439 Carcinoma in situ of skin of other parts of face: Secondary | ICD-10-CM | POA: Diagnosis not present

## 2019-11-23 DIAGNOSIS — Z85828 Personal history of other malignant neoplasm of skin: Secondary | ICD-10-CM | POA: Diagnosis not present

## 2019-11-23 DIAGNOSIS — L570 Actinic keratosis: Secondary | ICD-10-CM | POA: Diagnosis not present

## 2020-02-17 DIAGNOSIS — E782 Mixed hyperlipidemia: Secondary | ICD-10-CM | POA: Diagnosis not present

## 2020-02-17 DIAGNOSIS — I10 Essential (primary) hypertension: Secondary | ICD-10-CM | POA: Diagnosis not present

## 2020-02-17 DIAGNOSIS — Z125 Encounter for screening for malignant neoplasm of prostate: Secondary | ICD-10-CM | POA: Diagnosis not present

## 2020-02-22 DIAGNOSIS — Z23 Encounter for immunization: Secondary | ICD-10-CM | POA: Diagnosis not present

## 2020-02-22 DIAGNOSIS — E782 Mixed hyperlipidemia: Secondary | ICD-10-CM | POA: Diagnosis not present

## 2020-02-22 DIAGNOSIS — N401 Enlarged prostate with lower urinary tract symptoms: Secondary | ICD-10-CM | POA: Diagnosis not present

## 2020-02-22 DIAGNOSIS — D890 Polyclonal hypergammaglobulinemia: Secondary | ICD-10-CM | POA: Diagnosis not present

## 2020-02-22 DIAGNOSIS — R292 Abnormal reflex: Secondary | ICD-10-CM | POA: Diagnosis not present

## 2020-02-22 DIAGNOSIS — L304 Erythema intertrigo: Secondary | ICD-10-CM | POA: Diagnosis not present

## 2020-02-22 DIAGNOSIS — M4807 Spinal stenosis, lumbosacral region: Secondary | ICD-10-CM | POA: Diagnosis not present

## 2020-02-22 DIAGNOSIS — M109 Gout, unspecified: Secondary | ICD-10-CM | POA: Diagnosis not present

## 2020-02-22 DIAGNOSIS — Z0001 Encounter for general adult medical examination with abnormal findings: Secondary | ICD-10-CM | POA: Diagnosis not present

## 2020-02-22 DIAGNOSIS — I10 Essential (primary) hypertension: Secondary | ICD-10-CM | POA: Diagnosis not present

## 2020-02-22 DIAGNOSIS — K219 Gastro-esophageal reflux disease without esophagitis: Secondary | ICD-10-CM | POA: Diagnosis not present

## 2020-02-22 DIAGNOSIS — K4090 Unilateral inguinal hernia, without obstruction or gangrene, not specified as recurrent: Secondary | ICD-10-CM | POA: Diagnosis not present

## 2020-03-04 DIAGNOSIS — R21 Rash and other nonspecific skin eruption: Secondary | ICD-10-CM

## 2020-03-04 HISTORY — DX: Rash and other nonspecific skin eruption: R21

## 2020-03-16 ENCOUNTER — Ambulatory Visit: Payer: Self-pay | Admitting: Surgery

## 2020-03-16 DIAGNOSIS — K4090 Unilateral inguinal hernia, without obstruction or gangrene, not specified as recurrent: Secondary | ICD-10-CM | POA: Diagnosis not present

## 2020-03-16 NOTE — H&P (Signed)
Surgical Evaluation   OAC:ZYSA pleasant 79 year old male known to me following upper scopic repair of a large right inguinal hernia in March 2018, with subsequent open repair of a early recurrence in July of the same year. He has been doing well since I last saw him, but last few months has noted a intermittent mass in the left groin. This is reducible when he is supine or if he pushes on it. Mildly uncomfortable but no particular pain. Denies any GI issues related to the hernia although for the last 7 or 8 months he has noted intermittent constipation that is relieved with milk of magnesia. Denies any melena or hematochezia or change in stool caliber. Reports normal bladder function. Since I last saw him he has lost quite a bit of weight, this has been intentional through walking and decreased portion sizes, he is no longer losing weight and states that his weight is stable. Last colonoscopy was 6 or 7 years ago and he states that he has never had any polyps or abnormal findings.  No Known Allergies  Past Medical History:  Diagnosis Date  . A-fib ([place])   . AP (abdominal pain)   . Back pain    bad disc  . Dysrhythmia   . GERD (gastroesophageal reflux disease)   . Hemorrhoids   . History of hiatal hernia   . History of kidney stones   . HLD (hyperlipidemia)   . HTN (hypertension)   . 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 Right inguinal hernia   . Sleep apnea    discontinued 3-4 years ago  no longer wears a mask    Past Surgical History:  Procedure Laterality Date  . COLONOSCOPY     x 2 along with endoscopy each time   . DIAGNOSTIC LAPAROSCOPY    . HERNIA REPAIR     Right  . INGUINAL HERNIA REPAIR Right 08/17/2016   Procedure: LAPAROSCOPIC RIGHT INGUINAL HERNIA REPAIR;  Surgeon: Clovis Riley, MD;  Location: [facility];  Service: General;  Laterality: Right;  . INGUINAL HERNIA REPAIR Right 01/25/2017   Procedure: RIGHT INGUINAL HERNIA REPAIR WITH MESH;  Surgeon: Clovis Riley, MD;  Location: [place];  Service: General;  Laterality: Right;  . INSERTION OF MESH Right 01/25/2017   Procedure: INSERTION OF MESH;  Surgeon: Clovis Riley, MD;  Location: [place];  Service: General;  Laterality: Right;  . LAPAROSCOPIC REMOVAL OF MESENTERIC MASS N/A 08/17/2016   Procedure: BIOPSY OF MESENTERIC MASS;  Surgeon: Clovis Riley, MD;  Location: [facility];  Service: General;  Laterality: N/A;    Family History  Problem Relation Age of Onset  . Hypertension Mother   . CVA Father   . Hypertension Father   . Heart attack Father   . Diabetes Brother   . Hypertension Brother   . Colon polyps Brother   . Melanoma Brother   . Stroke Brother   . Cancer Brother        kidney    Social History   Socioeconomic History  . Marital status: Married    Spouse name: Presenter, broadcasting  . Number of children: 0  . Years of education: college grad  . Highest education level: Not on file  Occupational History    Comment: pastor, retired  Tobacco Use  . Smoking status: Former Smoker    Types: Cigarettes  . Smokeless tobacco: Never Used  . Tobacco comment: 1-2 years in high school  Vaping Use  .  Vaping Use: Never used  Substance and Sexual Activity  . Alcohol use: Never  . Drug use: Never  . Sexual activity: Yes  Other Topics Concern  . Not on file  Social History Narrative   Lives with wife   Caffeine- coffee 1 cup daily   Social Determinants of Health   Financial Resource Strain:   . Difficulty of Paying Living Expenses: Not on file  Food Insecurity:   . Worried About Charity fundraiser in the Last Year: Not on file  . Ran Out of Food in the Last Year: Not on file  Transportation Needs:   . Lack of Transportation (Medical): Not on file  . Lack of Transportation (Non-Medical): Not on file  Physical Activity:   . Days of Exercise per Week: Not on file  . Minutes of Exercise per Session: Not on file  Stress:   . Feeling of Stress : Not on file  Social Connections:    . Frequency of Communication with Friends and Family: Not on file  . Frequency of Social Gatherings with Friends and Family: Not on file  . Attends Religious Services: Not on file  . Active Member of Clubs or Organizations: Not on file  . Attends Archivist Meetings: Not on file  . Marital Status: Not on file    Current Outpatient Medications on File Prior to Visit  Medication Sig Dispense Refill  . allopurinol (ZYLOPRIM) 300 MG tablet Take 300 mg by mouth daily.    Marland Kitchen amLODipine (NORVASC) 5 MG tablet Take 5 mg by mouth daily.    Marland Kitchen aspirin EC 81 MG tablet Take 81 mg by mouth every evening.    Mariane Baumgarten Calcium (STOOL SOFTENER PO) Take 1 tablet by mouth 2 (two) times daily.    . metoprolol (LOPRESSOR) 50 MG tablet Take 50 mg by mouth 2 (two) times daily.    . naproxen sodium (ANAPROX) 220 MG tablet Take 220 mg by mouth 2 (two) times daily as needed.     Marland Kitchen oxyCODONE-acetaminophen (PERCOCET/ROXICET) 5-325 MG tablet Take 1 tablet by mouth every 6 (six) hours as needed for severe pain. (Patient not taking: Reported on 03/25/2018) 30 tablet 0  . potassium chloride (K-DUR,KLOR-CON) 10 MEQ tablet Take 10 mEq by mouth every other day.    . ranitidine (ZANTAC) 150 MG tablet Take 1 tablet by mouth daily as needed (heartburn).    . rosuvastatin (CRESTOR) 10 MG tablet Take 20 mg by mouth every other day.      No current facility-administered medications on file prior to visit.    Review of Systems: a complete, 10pt review of systems was completed with pertinent positives and negatives as documented in the HPI  Physical Exam: Vitals Weight: 216 lb Height: 73in Body Surface Area: 2.22 m Body Mass Index: 28.5 kg/m  Temp.: 98.32F  Pulse: 70 (Regular)  BP: 124/72(Sitting, Left Arm, Standard)  Alert and well-appearing. Unlabored respirations. Palpable reducible left inguinal hernia. Right inguinal hernia repair feels intact.    CBC Latest Ref Rng & Units 01/23/2017  08/18/2016 08/14/2016  WBC 4.0 - 10.5 K/uL 9.0 17.1(H) 10.5  Hemoglobin 13.0 - 17.0 g/dL 13.8 13.1 13.9  Hematocrit 39 - 52 % 41.0 38.0(L) 41.3  Platelets 150 - 400 K/uL 246 239 254    CMP Latest Ref Rng & Units 03/25/2018 01/23/2017 08/18/2016  Glucose 65 - 99 mg/dL - 114(H) 131(H)  BUN 6 - 20 mg/dL - 12 16  Creatinine 0.61 - 1.24 mg/dL -  1.09 1.13  Sodium 135 - 145 mmol/L - 140 140  Potassium 3.5 - 5.1 mmol/L - 3.3(L) 3.6  Chloride 101 - 111 mmol/L - 104 103  CO2 22 - 32 mmol/L - 28 29  Calcium 8.9 - 10.3 mg/dL - 9.2 9.0  Total Protein 6.0 - 8.5 g/dL 6.7 - -  Total Bilirubin 0.3 - 1.2 mg/dL - - -  Alkaline Phos 38 - 126 U/L - - -  AST 15 - 41 U/L - - -  ALT 17 - 63 U/L - - -    Lab Results  Component Value Date   INR 1.08 07/06/2016    Imaging: No results found.   A/P: LEFT INGUINAL HERNIA (K40.90) Story: At the time of his laparoscopic procedure in 2018, I noted adhesions of the sigmoid colon to the peritoneum overlying the region of the internal ring but no apparent hernia at that time. There is a palpable hernia today. I recommended open repair with mesh. We discussed the relevant anatomy and we discussed the technique of the procedure. Discussed risks of bleeding, infection, pain, scarring, injury to structures in the area including nerves, blood vessels, bowel, bladder, risk of chronic pain, hernia recurrence, risk of seroma or hematoma, urinary retention, and risks of general anesthesia including cardiovascular, pulmonary, and thromboembolic complications. Questions were answered. Patient wishes to proceed with scheduling.    Patient Active Problem List   Diagnosis Date Noted  . Incarcerated right inguinal hernia 08/17/2016       Romana Juniper, MD [facility], [place]  See AMION to contact appropriate on-call provider

## 2020-03-16 NOTE — H&P (View-Only) (Signed)
Surgical Evaluation   NOB:SJGG pleasant 78 year old male known to me following upper scopic repair of a large right inguinal hernia in March 2018, with subsequent open repair of a early recurrence in July of the same year. He has been doing well since I last saw him, but last few months has noted a intermittent mass in the left groin. This is reducible when he is supine or if he pushes on it. Mildly uncomfortable but no particular pain. Denies any GI issues related to the hernia although for the last 7 or 8 months he has noted intermittent constipation that is relieved with milk of magnesia. Denies any melena or hematochezia or change in stool caliber. Reports normal bladder function. Since I last saw him he has lost quite a bit of weight, this has been intentional through walking and decreased portion sizes, he is no longer losing weight and states that his weight is stable. Last colonoscopy was 6 or 7 years ago and he states that he has never had any polyps or abnormal findings.  No Known Allergies  Past Medical History:  Diagnosis Date  . A-fib ([place])   . AP (abdominal pain)   . Back pain    bad disc  . Dysrhythmia   . GERD (gastroesophageal reflux disease)   . Hemorrhoids   . History of hiatal hernia   . History of kidney stones   . HLD (hyperlipidemia)   . HTN (hypertension)   . Melanoma ([place])    skin cancer ears  . Right inguinal hernia   . Sleep apnea    discontinued 3-4 years ago  no longer wears a mask    Past Surgical History:  Procedure Laterality Date  . COLONOSCOPY     x 2 along with endoscopy each time   . DIAGNOSTIC LAPAROSCOPY    . HERNIA REPAIR     Right  . INGUINAL HERNIA REPAIR Right 08/17/2016   Procedure: LAPAROSCOPIC RIGHT INGUINAL HERNIA REPAIR;  Surgeon: Clovis Riley, MD;  Location: [facility];  Service: General;  Laterality: Right;  . INGUINAL HERNIA REPAIR Right 01/25/2017   Procedure: RIGHT INGUINAL HERNIA REPAIR WITH MESH;  Surgeon: Clovis Riley, MD;  Location: [facility];  Service: General;  Laterality: Right;  . INSERTION OF MESH Right 01/25/2017   Procedure: INSERTION OF MESH;  Surgeon: Clovis Riley, MD;  Location: [facility];  Service: General;  Laterality: Right;  . LAPAROSCOPIC REMOVAL OF MESENTERIC MASS N/A 08/17/2016   Procedure: BIOPSY OF MESENTERIC MASS;  Surgeon: Clovis Riley, MD;  [facility];  Service: General;  Laterality: N/A;    Family History  Problem Relation Age of Onset  . Hypertension Mother   . CVA Father   . Hypertension Father   . Heart attack Father   . Diabetes Brother   . Hypertension Brother   . Colon polyps Brother   . Melanoma Brother   . Stroke Brother   . Cancer Brother        kidney    Social History   Socioeconomic History  . Marital status: Married    Spouse name: Presenter, broadcasting  . Number of children: 0  . Years of education: college grad  . Highest education level: Not on file  Occupational History    Comment: pastor, retired  Tobacco Use  . Smoking status: Former Smoker    Types: Cigarettes  . Smokeless tobacco: Never Used  . Tobacco comment: 1-2 years in high school  Vaping Use  .  Vaping Use: Never used  Substance and Sexual Activity  . Alcohol use: Never  . Drug use: Never  . Sexual activity: Yes  Other Topics Concern  . Not on file  Social History Narrative   Lives with wife   Caffeine- coffee 1 cup daily   Social Determinants of Health   Financial Resource Strain:   . Difficulty of Paying Living Expenses: Not on file  Food Insecurity:   . Worried About Charity fundraiser in the Last Year: Not on file  . Ran Out of Food in the Last Year: Not on file  Transportation Needs:   . Lack of Transportation (Medical): Not on file  . Lack of Transportation (Non-Medical): Not on file  Physical Activity:   . Days of Exercise per Week: Not on file  . Minutes of Exercise per Session: Not on file  Stress:   . Feeling of Stress : Not on file  Social Connections:    . Frequency of Communication with Friends and Family: Not on file  . Frequency of Social Gatherings with Friends and Family: Not on file  . Attends Religious Services: Not on file  . Active Member of Clubs or Organizations: Not on file  . Attends Archivist Meetings: Not on file  . Marital Status: Not on file    Current Outpatient Medications on File Prior to Visit  Medication Sig Dispense Refill  . allopurinol (ZYLOPRIM) 300 MG tablet Take 300 mg by mouth daily.    Marland Kitchen amLODipine (NORVASC) 5 MG tablet Take 5 mg by mouth daily.    Marland Kitchen aspirin EC 81 MG tablet Take 81 mg by mouth every evening.    Mariane Baumgarten Calcium (STOOL SOFTENER PO) Take 1 tablet by mouth 2 (two) times daily.    . metoprolol (LOPRESSOR) 50 MG tablet Take 50 mg by mouth 2 (two) times daily.    . naproxen sodium (ANAPROX) 220 MG tablet Take 220 mg by mouth 2 (two) times daily as needed.     Marland Kitchen oxyCODONE-acetaminophen (PERCOCET/ROXICET) 5-325 MG tablet Take 1 tablet by mouth every 6 (six) hours as needed for severe pain. (Patient not taking: Reported on 03/25/2018) 30 tablet 0  . potassium chloride (K-DUR,KLOR-CON) 10 MEQ tablet Take 10 mEq by mouth every other day.    . ranitidine (ZANTAC) 150 MG tablet Take 1 tablet by mouth daily as needed (heartburn).    . rosuvastatin (CRESTOR) 10 MG tablet Take 20 mg by mouth every other day.      No current facility-administered medications on file prior to visit.    Review of Systems: a complete, 10pt review of systems was completed with pertinent positives and negatives as documented in the HPI  Physical Exam: Vitals Weight: 216 lb Height: 73in Body Surface Area: 2.22 m Body Mass Index: 28.5 kg/m  Temp.: 98.56F  Pulse: 70 (Regular)  BP: 124/72(Sitting, Left Arm, Standard)  Alert and well-appearing. Unlabored respirations. Palpable reducible left inguinal hernia. Right inguinal hernia repair feels intact.    CBC Latest Ref Rng & Units 01/23/2017  08/18/2016 08/14/2016  WBC 4.0 - 10.5 K/uL 9.0 17.1(H) 10.5  Hemoglobin 13.0 - 17.0 g/dL 13.8 13.1 13.9  Hematocrit 39 - 52 % 41.0 38.0(L) 41.3  Platelets 150 - 400 K/uL 246 239 254    CMP Latest Ref Rng & Units 03/25/2018 01/23/2017 08/18/2016  Glucose 65 - 99 mg/dL - 114(H) 131(H)  BUN 6 - 20 mg/dL - 12 16  Creatinine 0.61 - 1.24 mg/dL -  1.09 1.13  Sodium 135 - 145 mmol/L - 140 140  Potassium 3.5 - 5.1 mmol/L - 3.3(L) 3.6  Chloride 101 - 111 mmol/L - 104 103  CO2 22 - 32 mmol/L - 28 29  Calcium 8.9 - 10.3 mg/dL - 9.2 9.0  Total Protein 6.0 - 8.5 g/dL 6.7 - -  Total Bilirubin 0.3 - 1.2 mg/dL - - -  Alkaline Phos 38 - 126 U/L - - -  AST 15 - 41 U/L - - -  ALT 17 - 63 U/L - - -    Lab Results  Component Value Date   INR 1.08 07/06/2016    Imaging: No results found.   A/P: LEFT INGUINAL HERNIA (K40.90) Story: At the time of his laparoscopic procedure in 2018, I noted adhesions of the sigmoid colon to the peritoneum overlying the region of the internal ring but no apparent hernia at that time. There is a palpable hernia today. I recommended open repair with mesh. We discussed the relevant anatomy and we discussed the technique of the procedure. Discussed risks of bleeding, infection, pain, scarring, injury to structures in the area including nerves, blood vessels, bowel, bladder, risk of chronic pain, hernia recurrence, risk of seroma or hematoma, urinary retention, and risks of general anesthesia including cardiovascular, pulmonary, and thromboembolic complications. Questions were answered. Patient wishes to proceed with scheduling.    Patient Active Problem List   Diagnosis Date Noted  . Incarcerated right inguinal hernia 08/17/2016       Romana Juniper, MD [facility], [place]  See AMION to contact appropriate on-call provider

## 2020-03-18 DIAGNOSIS — L304 Erythema intertrigo: Secondary | ICD-10-CM | POA: Diagnosis not present

## 2020-03-24 DIAGNOSIS — L304 Erythema intertrigo: Secondary | ICD-10-CM | POA: Diagnosis not present

## 2020-03-24 DIAGNOSIS — Z85828 Personal history of other malignant neoplasm of skin: Secondary | ICD-10-CM | POA: Diagnosis not present

## 2020-03-24 DIAGNOSIS — D0439 Carcinoma in situ of skin of other parts of face: Secondary | ICD-10-CM | POA: Diagnosis not present

## 2020-03-24 DIAGNOSIS — X32XXXD Exposure to sunlight, subsequent encounter: Secondary | ICD-10-CM | POA: Diagnosis not present

## 2020-03-24 DIAGNOSIS — L570 Actinic keratosis: Secondary | ICD-10-CM | POA: Diagnosis not present

## 2020-03-24 DIAGNOSIS — Z08 Encounter for follow-up examination after completed treatment for malignant neoplasm: Secondary | ICD-10-CM | POA: Diagnosis not present

## 2020-04-05 ENCOUNTER — Encounter (HOSPITAL_BASED_OUTPATIENT_CLINIC_OR_DEPARTMENT_OTHER): Payer: Self-pay | Admitting: Surgery

## 2020-04-05 ENCOUNTER — Other Ambulatory Visit: Payer: Self-pay

## 2020-04-05 NOTE — Progress Notes (Addendum)
Spoke w/ via phone for pre-op interview---pt Lab needs dos---- I stat 8              Lab results------ekg 02-22-2020 dr Shelia Media on chart lov dr Shelia Media 03-18-2020 on chart COVID test ------04-09-2020 1100 am Arrive at -------930 am 04-13-2020 NPO after MN NO Solid Food.  Clear liquids from MN until---830 am then npo Medications to take morning of surgery ---metoprolol, allopurinol, amlodipine-- Diabetic medication -----n/a Patient Special Instructions -----none Pre-Op special Istructions -----none Patient verbalized understanding of instructions that were given at this phone interview. Patient denies shortness of breath, chest pain, fever, cough at this phone interview.

## 2020-04-09 ENCOUNTER — Other Ambulatory Visit (HOSPITAL_COMMUNITY)
Admission: RE | Admit: 2020-04-09 | Discharge: 2020-04-09 | Disposition: A | Discharge status: Home / Self Care | Payer: PPO | Source: Ambulatory Visit | Attending: Surgery | Admitting: Surgery

## 2020-04-09 DIAGNOSIS — Z20822 Contact with and (suspected) exposure to covid-19: Secondary | ICD-10-CM | POA: Insufficient documentation

## 2020-04-09 DIAGNOSIS — Z01812 Encounter for preprocedural laboratory examination: Secondary | ICD-10-CM | POA: Insufficient documentation

## 2020-04-09 LAB — SARS CORONAVIRUS 2 (TAT 6-24 HRS): SARS Coronavirus 2: NEGATIVE

## 2020-04-13 ENCOUNTER — Other Ambulatory Visit: Payer: Self-pay

## 2020-04-13 ENCOUNTER — Ambulatory Visit (HOSPITAL_BASED_OUTPATIENT_CLINIC_OR_DEPARTMENT_OTHER): Payer: PPO | Admitting: Anesthesiology

## 2020-04-13 ENCOUNTER — Encounter (HOSPITAL_BASED_OUTPATIENT_CLINIC_OR_DEPARTMENT_OTHER): Payer: Self-pay | Admitting: Surgery

## 2020-04-13 ENCOUNTER — Encounter (HOSPITAL_BASED_OUTPATIENT_CLINIC_OR_DEPARTMENT_OTHER)
Admission: RE | Disposition: A | Discharge status: Home / Self Care | Payer: Self-pay | Source: Home / Self Care | Attending: Surgery

## 2020-04-13 ENCOUNTER — Ambulatory Visit (HOSPITAL_BASED_OUTPATIENT_CLINIC_OR_DEPARTMENT_OTHER)
Admission: RE | Admit: 2020-04-13 | Discharge: 2020-04-13 | Disposition: A | Discharge status: Home / Self Care | Payer: PPO | Attending: Surgery | Admitting: Surgery

## 2020-04-13 DIAGNOSIS — Z87891 Personal history of nicotine dependence: Secondary | ICD-10-CM | POA: Diagnosis not present

## 2020-04-13 DIAGNOSIS — K4090 Unilateral inguinal hernia, without obstruction or gangrene, not specified as recurrent: Secondary | ICD-10-CM | POA: Diagnosis not present

## 2020-04-13 DIAGNOSIS — Z7982 Long term (current) use of aspirin: Secondary | ICD-10-CM | POA: Diagnosis not present

## 2020-04-13 DIAGNOSIS — Z79899 Other long term (current) drug therapy: Secondary | ICD-10-CM | POA: Diagnosis not present

## 2020-04-13 DIAGNOSIS — K449 Diaphragmatic hernia without obstruction or gangrene: Secondary | ICD-10-CM | POA: Diagnosis not present

## 2020-04-13 DIAGNOSIS — D176 Benign lipomatous neoplasm of spermatic cord: Secondary | ICD-10-CM | POA: Insufficient documentation

## 2020-04-13 DIAGNOSIS — E785 Hyperlipidemia, unspecified: Secondary | ICD-10-CM | POA: Diagnosis not present

## 2020-04-13 DIAGNOSIS — I10 Essential (primary) hypertension: Secondary | ICD-10-CM | POA: Diagnosis not present

## 2020-04-13 DIAGNOSIS — K4030 Unilateral inguinal hernia, with obstruction, without gangrene, not specified as recurrent: Secondary | ICD-10-CM | POA: Diagnosis not present

## 2020-04-13 HISTORY — PX: INGUINAL HERNIA REPAIR: SHX194

## 2020-04-13 HISTORY — DX: Personal history of other diseases of the musculoskeletal system and connective tissue: Z87.39

## 2020-04-13 HISTORY — DX: Unilateral inguinal hernia, without obstruction or gangrene, not specified as recurrent: K40.90

## 2020-04-13 LAB — POCT I-STAT, CHEM 8
BUN: 21 mg/dL (ref 8–23)
Calcium, Ion: 1.24 mmol/L (ref 1.15–1.40)
Chloride: 99 mmol/L (ref 98–111)
Creatinine, Ser: 1.4 mg/dL — ABNORMAL HIGH (ref 0.61–1.24)
Glucose, Bld: 98 mg/dL (ref 70–99)
HCT: 41 % (ref 39.0–52.0)
Hemoglobin: 13.9 g/dL (ref 13.0–17.0)
Potassium: 3.3 mmol/L — ABNORMAL LOW (ref 3.5–5.1)
Sodium: 143 mmol/L (ref 135–145)
TCO2: 29 mmol/L (ref 22–32)

## 2020-04-13 SURGERY — REPAIR, HERNIA, INGUINAL, ADULT
Anesthesia: General | Site: Groin | Laterality: Left

## 2020-04-13 MED ORDER — ACETAMINOPHEN 500 MG PO TABS
ORAL_TABLET | ORAL | Status: AC
  Filled 2020-04-13: qty 2

## 2020-04-13 MED ORDER — CEFAZOLIN SODIUM-DEXTROSE 2-4 GM/100ML-% IV SOLN
2.0000 g | INTRAVENOUS | Status: AC
  Administered 2020-04-13: 2 g via INTRAVENOUS

## 2020-04-13 MED ORDER — CEFAZOLIN SODIUM-DEXTROSE 1-4 GM/50ML-% IV SOLN
INTRAVENOUS | Status: AC
  Filled 2020-04-13: qty 50

## 2020-04-13 MED ORDER — EPHEDRINE SULFATE-NACL 50-0.9 MG/10ML-% IV SOSY
PREFILLED_SYRINGE | INTRAVENOUS | Status: DC | PRN
  Administered 2020-04-13 (×3): 10 mg via INTRAVENOUS

## 2020-04-13 MED ORDER — DEXAMETHASONE SODIUM PHOSPHATE 10 MG/ML IJ SOLN
INTRAMUSCULAR | Status: AC
  Filled 2020-04-13: qty 1

## 2020-04-13 MED ORDER — BUPIVACAINE LIPOSOME 1.3 % IJ SUSP: 20.0000 mL | Freq: Once | INTRAMUSCULAR | Status: DC

## 2020-04-13 MED ORDER — ONDANSETRON HCL 4 MG/2ML IJ SOLN
INTRAMUSCULAR | Status: DC | PRN
  Administered 2020-04-13: 4 mg via INTRAVENOUS

## 2020-04-13 MED ORDER — ACETAMINOPHEN 500 MG PO TABS
1000.0000 mg | ORAL_TABLET | ORAL | Status: AC
  Administered 2020-04-13: 1000 mg via ORAL

## 2020-04-13 MED ORDER — FENTANYL CITRATE (PF) 100 MCG/2ML IJ SOLN
INTRAMUSCULAR | Status: AC
  Filled 2020-04-13: qty 2

## 2020-04-13 MED ORDER — ONDANSETRON HCL 4 MG/2ML IJ SOLN
INTRAMUSCULAR | Status: AC
  Filled 2020-04-13: qty 2

## 2020-04-13 MED ORDER — BUPIVACAINE LIPOSOME 1.3 % IJ SUSP
INTRAMUSCULAR | Status: DC | PRN
  Administered 2020-04-13: 20 mL

## 2020-04-13 MED ORDER — PROPOFOL 10 MG/ML IV BOLUS
INTRAVENOUS | Status: DC | PRN
  Administered 2020-04-13: 150 mg via INTRAVENOUS

## 2020-04-13 MED ORDER — OXYCODONE HCL 5 MG PO TABS
5.0000 mg | ORAL_TABLET | Freq: Three times a day (TID) | ORAL | 0 refills | Status: DC | PRN
Start: 2020-04-13 — End: 2021-03-07

## 2020-04-13 MED ORDER — LACTATED RINGERS IV SOLN
INTRAVENOUS | Status: DC
  Administered 2020-04-13: 50 mL via INTRAVENOUS

## 2020-04-13 MED ORDER — BUPIVACAINE-EPINEPHRINE 0.25% -1:200000 IJ SOLN
INTRAMUSCULAR | Status: DC | PRN
  Administered 2020-04-13: 30 mL

## 2020-04-13 MED ORDER — LIDOCAINE 2% (20 MG/ML) 5 ML SYRINGE
INTRAMUSCULAR | Status: AC
  Filled 2020-04-13: qty 5

## 2020-04-13 MED ORDER — DEXAMETHASONE SODIUM PHOSPHATE 10 MG/ML IJ SOLN
INTRAMUSCULAR | Status: DC | PRN
  Administered 2020-04-13: 4 mg via INTRAVENOUS

## 2020-04-13 MED ORDER — CHLORHEXIDINE GLUCONATE 4 % EX LIQD: 60.0000 mL | Freq: Once | CUTANEOUS | Status: DC

## 2020-04-13 MED ORDER — LIDOCAINE 2% (20 MG/ML) 5 ML SYRINGE
INTRAMUSCULAR | Status: DC | PRN
  Administered 2020-04-13: 80 mg via INTRAVENOUS

## 2020-04-13 MED ORDER — PROPOFOL 10 MG/ML IV BOLUS
INTRAVENOUS | Status: AC
  Filled 2020-04-13: qty 20

## 2020-04-13 MED ORDER — DOCUSATE SODIUM 100 MG PO CAPS: 100.0000 mg | ORAL_CAPSULE | Freq: Two times a day (BID) | ORAL | 0 refills | Status: AC

## 2020-04-13 MED ORDER — FENTANYL CITRATE (PF) 100 MCG/2ML IJ SOLN
INTRAMUSCULAR | Status: DC | PRN
Start: 2020-04-13 — End: 2020-04-13
  Administered 2020-04-13: 12.5 ug via INTRAVENOUS
  Administered 2020-04-13: 50 ug via INTRAVENOUS
  Administered 2020-04-13: 25 ug via INTRAVENOUS
  Administered 2020-04-13: 12.5 ug via INTRAVENOUS

## 2020-04-13 SURGICAL SUPPLY — 46 items
APL PRP STRL LF DISP 70% ISPRP (MISCELLANEOUS) ×1
APL SKNCLS STERI-STRIP NONHPOA (GAUZE/BANDAGES/DRESSINGS) ×1
BENZOIN TINCTURE PRP APPL 2/3 (GAUZE/BANDAGES/DRESSINGS) ×3 IMPLANT
BLADE HEX COATED 2.75 (ELECTRODE) ×3 IMPLANT
BLADE SURG 15 STRL LF DISP TIS (BLADE) ×2 IMPLANT
BLADE SURG 15 STRL SS (BLADE) ×6
CHLORAPREP W/TINT 26 (MISCELLANEOUS) ×3 IMPLANT
CLOSURE WOUND 1/2 X4 (GAUZE/BANDAGES/DRESSINGS) ×1
COVER BACK TABLE 60X90IN (DRAPES) ×3 IMPLANT
COVER MAYO STAND STRL (DRAPES) ×3 IMPLANT
COVER WAND RF STERILE (DRAPES) ×3 IMPLANT
DECANTER SPIKE VIAL GLASS SM (MISCELLANEOUS) IMPLANT
DRAIN PENROSE 0.5X18 (DRAIN) ×3 IMPLANT
DRAPE LAPAROTOMY TRNSV 102X78 (DRAPES) ×3 IMPLANT
DRAPE UTILITY XL STRL (DRAPES) ×3 IMPLANT
GAUZE SPONGE 4X4 12PLY STRL (GAUZE/BANDAGES/DRESSINGS) ×3 IMPLANT
GLOVE BIO SURGEON STRL SZ 6 (GLOVE) ×3 IMPLANT
GLOVE BIOGEL PI IND STRL 6.5 (GLOVE) ×1 IMPLANT
GLOVE BIOGEL PI INDICATOR 6.5 (GLOVE) ×2
GLOVE ECLIPSE 8.0 STRL XLNG CF (GLOVE) ×3 IMPLANT
GLOVE INDICATOR 8.0 STRL GRN (GLOVE) ×3 IMPLANT
GOWN STRL REUS W/TWL LRG LVL3 (GOWN DISPOSABLE) ×3 IMPLANT
MESH ULTRAPRO 3X6 7.6X15CM (Mesh General) ×3 IMPLANT
NEEDLE HYPO 22GX1.5 SAFETY (NEEDLE) ×3 IMPLANT
PACK BASIN DAY SURGERY FS (CUSTOM PROCEDURE TRAY) ×3 IMPLANT
PENCIL SMOKE EVACUATOR (MISCELLANEOUS) ×3 IMPLANT
SPONGE LAP 4X18 RFD (DISPOSABLE) ×3 IMPLANT
STRIP CLOSURE SKIN 1/2X4 (GAUZE/BANDAGES/DRESSINGS) ×2 IMPLANT
SUT ETHIBOND 0 (SUTURE) ×6 IMPLANT
SUT ETHIBOND 0 MO6 C/R (SUTURE) ×3 IMPLANT
SUT MNCRL AB 4-0 PS2 18 (SUTURE) ×3 IMPLANT
SUT PDS AB 2-0 CT2 27 (SUTURE) ×6 IMPLANT
SUT PROLENE 2 0 CT2 30 (SUTURE) IMPLANT
SUT VIC AB 0 CT1 27 (SUTURE) ×3
SUT VIC AB 0 CT1 27XBRD ANBCTR (SUTURE) ×1 IMPLANT
SUT VIC AB 3-0 SH 27 (SUTURE) ×6
SUT VIC AB 3-0 SH 27X BRD (SUTURE) ×2 IMPLANT
SUT VICRYL AB 3 0 TIES (SUTURE) ×3 IMPLANT
SYR 10ML LL (SYRINGE) ×3 IMPLANT
SYR BULB IRRIG 60ML STRL (SYRINGE) ×3 IMPLANT
SYR CONTROL 10ML LL (SYRINGE) ×3 IMPLANT
TAPE HYPAFIX 4 X10 (GAUZE/BANDAGES/DRESSINGS) ×3 IMPLANT
TOWEL OR 17X26 10 PK STRL BLUE (TOWEL DISPOSABLE) ×3 IMPLANT
TRAY FOL W/BAG SLVR 16FR STRL (SET/KITS/TRAYS/PACK) ×1 IMPLANT
TRAY FOLEY W/BAG SLVR 16FR LF (SET/KITS/TRAYS/PACK) ×3
YANKAUER SUCT BULB TIP NO VENT (SUCTIONS) IMPLANT

## 2020-04-13 NOTE — Transfer of Care (Signed)
Immediate Anesthesia Transfer of Care Note  Patient: Allen Pierce  Procedure(s) Performed: Procedure(s) (LRB): OPEN LEFT INGUINAL HERNIA REPAIR WITH MESH (Left)  Patient Location: PACU  Anesthesia Type: General  Level of Consciousness: awake, alert  and oriented  Airway & Oxygen Therapy: Patient Spontanous Breathing and Patient connected to nasal cannula oxygen  Post-op Assessment: Report given to PACU RN and Post -op Vital signs reviewed and stable  Post vital signs: Reviewed and stable  Complications: No apparent anesthesia complications  Last Vitals:  Vitals Value Taken Time  BP    Temp    Pulse    Resp    SpO2      Last Pain:  Vitals:   04/13/20 1005  TempSrc: Oral  PainSc: 4       Patients Stated Pain Goal: 3 (93/55/21 [id number])  Complications: No complications documented.

## 2020-04-13 NOTE — Discharge Instructions (Signed)
HERNIA REPAIR: POST OP INSTRUCTIONS   EAT Gradually transition to a high fiber diet with a fiber supplement over the next few weeks after discharge.  Start with a pureed / full liquid diet (see below)  WALK Walk an hour a day (cumulative- not all at once).  Control your pain to do that.    CONTROL PAIN Control pain so that you can walk, sleep, tolerate sneezing/coughing, and go up/down stairs.  HAVE A BOWEL MOVEMENT DAILY Keep your bowels regular to avoid problems.  OK to try a laxative to override constipation.  OK to use an antidairrheal to slow down diarrhea.  Call if not better after 2 tries  CALL IF YOU HAVE PROBLEMS/CONCERNS Call if you are still struggling despite following these instructions. Call if you have concerns not answered by these instructions  ######################################################################    1. DIET: Follow a light bland diet & liquids the first 24 hours after arrival home, such as soup, liquids, starches, etc.  Be sure to drink plenty of fluids.  Quickly advance to a usual solid diet within a few days.  Avoid fast food or heavy meals as your are more likely to get nauseated or have irregular bowels.  A low-sugar, high-fiber diet for the rest of your life is ideal.   2. Take your usually prescribed home medications unless otherwise directed.  3. PAIN CONTROL: a. Pain is best controlled by a usual combination of three different methods TOGETHER: i. Ice/Heat ii. Over the counter pain medication iii. Prescription pain medication b. Most patients will experience some swelling and bruising around the hernia(s) such as the bellybutton, groins, or old incisions.  Ice packs or heating pads (30-60 minutes up to 6 times a day) will help. Use ice for the first few days to help decrease swelling and bruising, then switch to heat to help relax tight/sore spots and speed recovery.  Some people prefer to use ice alone, heat alone, alternating between ice &  heat.  Experiment to what works for you.  Swelling and bruising can take several weeks to resolve.   c. It is helpful to take an over-the-counter pain medication regularly for the first days: i. Naproxen (Aleve, etc)  Two 266m tabs twice a day OR Ibuprofen (Advil, etc) Three 2010mtabs four times a day (every meal & bedtime) AND ii. Acetaminophen (Tylenol, etc) 325-65057mour times a day (every meal & bedtime) d. A  prescription for pain medication should be given to you upon discharge.  Take your pain medication as prescribed, IF NEEDED.  i. If you are having problems/concerns with the prescription medicine (does not control pain, nausea, vomiting, rash, itching, etc), please call us [phone] see if we need to switch you to a different pain medicine that will work better for you and/or control your side effect better. ii. If you need a refill on your pain medication, please contact your pharmacy.  They will contact our office to request authorization. Prescriptions will not be filled after 5 pm or on week-ends.  4. Avoid getting constipated.  Between the surgery and the pain medications, it is common to experience some constipation.  Increasing fluid intake and taking a fiber supplement (such as Metamucil, Citrucel, FiberCon, MiraLax, etc) 1-2 times a day regularly will usually help prevent this problem from occurring.  A mild laxative (prune juice, Milk of Magnesia, MiraLax, etc) should be taken according to package directions if there are no bowel movements after 48 hours.    5.  Wash / shower every day, starting 2 days after surgery.  You may shower over the steri strips which are waterproof.    6. Remove your outer bandage 2 days after surgery.  You may leave the incision open to air.  You may replace a dressing/Band-Aid to cover an incision for comfort if you wish.  Continue to shower over incision(s) after the dressing is off.  7. ACTIVITIES as tolerated:   a. You may resume regular  (light) daily activities beginning the next day--such as daily self-care, walking, climbing stairs--gradually increasing activities as tolerated.  Control your pain so that you can walk an hour a day.  If you can walk 30 minutes without difficulty, it is safe to try more intense activity such as jogging, treadmill, bicycling, low-impact aerobics, swimming, etc. b. Refrain from the most intensive and strenuous activity such as sit-ups, heavy lifting, contact sports, etc  Refrain from any heavy lifting or straining until 6 weeks after surgery.   c. DO NOT PUSH THROUGH PAIN.  Let pain be your guide: If it hurts to do something, don't do it.  Pain is your body warning you to avoid that activity for another week until the pain goes down. d. You may drive when you are no longer taking prescription pain medication, you can comfortably wear a seatbelt, and you can safely maneuver your car and apply brakes. e. Dennis Bast may have sexual intercourse when it is comfortable.   8. FOLLOW UP in our office a. Please call [facility] at [phone] to set up an appointment to see your surgeon in the office for a follow-up appointment approximately 2-3 weeks after your surgery. b. Make sure that you call for this appointment the day you arrive home to insure a convenient appointment time.  9.  If you have disability of FMLA / Family leave forms, please bring the forms to the office for processing.  (do not give to your surgeon).  WHEN TO CALL us [phone]: 1. Poor pain control 2. Reactions / problems with new medications (rash/itching, nausea, etc)  3. Fever over 101.5 F (38.5 C) 4. Inability to urinate 5. Nausea and/or vomiting 6. Worsening swelling or bruising 7. Continued bleeding from incision. 8. Increased pain, redness, or drainage from the incision   The clinic staff is available to answer your questions during regular business hours (8:30am-5pm).  Please dont hesitate to call and ask to speak to one of our  nurses for clinical concerns.   If you have a medical emergency, go to the nearest emergency room or call 911.  A surgeon from [facility] is always on call at the hospitals in [facility], [place], [place], [place], [place]  [address] ?  [address] [address], [place], [place]   [address] MAIN: [id number] ? TOLL FREE: [phone] ? FAX: [phone] [email or web address] Information for Discharge Teaching: EXPAREL (bupivacaine liposome injectable suspension)   Your surgeon gave you EXPAREL(bupivacaine) in your surgical incision to help control your pain after surgery.   EXPAREL is a local anesthetic that provides pain relief by numbing the tissue around the surgical site.  EXPAREL is designed to release pain medication over time and can control pain for up to 72 hours.  Depending on how you respond to EXPAREL, you may require less pain medication during your recovery.  Possible side effects:  Temporary loss of sensation or ability to move in the area where bupivacaine was injected.  Nausea, vomiting,  constipation  Rarely, numbness and tingling in your mouth or lips, lightheadedness, or anxiety may occur.  Call your doctor right away if you think you may be experiencing any of these sensations, or if you have other questions regarding possible side effects.  Follow all other discharge instructions given to you by your surgeon or nurse. Eat a healthy diet and drink plenty of water or other fluids.  If you return to the hospital for any reason within 96 hours following the administration of EXPAREL, please inform your health care providers.  Post Anesthesia Home Care Instructions  Activity: Get plenty of rest for the remainder of the day. A responsible adult should stay with you for 24 hours following the procedure.  For the next 24 hours, DO NOT: -Drive a car -Paediatric nurse -Drink alcoholic beverages -Take any  medication unless instructed by your physician -Make any legal decisions or sign important papers.  Meals: Start with liquid foods such as gelatin or soup. Progress to regular foods as tolerated. Avoid greasy, spicy, heavy foods. If nausea and/or vomiting occur, drink only clear liquids until the nausea and/or vomiting subsides. Call your physician if vomiting continues.  Special Instructions/Symptoms: Your throat may feel dry or sore from the anesthesia or the breathing tube placed in your throat during surgery. If this causes discomfort, gargle with warm salt water. The discomfort should disappear within 24 hours.  If you had a scopolamine patch placed behind your ear for the management of post- operative nausea and/or vomiting:  1. The medication in the patch is effective for 72 hours, after which it should be removed.  Wrap patch in a tissue and discard in the trash. Wash hands thoroughly with soap and water. 2. You may remove the patch earlier than 72 hours if you experience unpleasant side effects which may include dry mouth, dizziness or visual disturbances. 3. Avoid touching the patch. Wash your hands with soap and water after contact with the patch.

## 2020-04-13 NOTE — Anesthesia Preprocedure Evaluation (Addendum)
Anesthesia Evaluation  Patient identified by MRN, date of birth, ID band Patient awake    Reviewed: Allergy & Precautions, NPO status , Patient's Chart, lab work & pertinent test results, reviewed documented beta blocker date and time   History of Anesthesia Complications Negative for: history of anesthetic complications  Airway Mallampati: II  TM Distance: >3 FB Neck ROM: Full    Dental  (+) Poor Dentition   Pulmonary sleep apnea , former smoker,    Pulmonary exam normal        Cardiovascular hypertension, Pt. on medications and Pt. on home beta blockers Normal cardiovascular exam+ dysrhythmias Atrial Fibrillation      Neuro/Psych negative neurological ROS  negative psych ROS   GI/Hepatic Neg liver ROS, hiatal hernia, GERD  ,  Endo/Other  negative endocrine ROS  Renal/GU Renal InsufficiencyRenal disease (Cr 1.40)  negative genitourinary   Musculoskeletal negative musculoskeletal ROS (+)   Abdominal   Peds  Hematology negative hematology ROS (+)   Anesthesia Other Findings   Reproductive/Obstetrics                           Anesthesia Physical Anesthesia Plan  ASA: II  Anesthesia Plan: General   Post-op Pain Management:    Induction: Intravenous  PONV Risk Score and Plan: 3 and Ondansetron, Dexamethasone, Midazolam and Treatment may vary due to age or medical condition  Airway Management Planned: LMA  Additional Equipment: None  Intra-op Plan:   Post-operative Plan: Extubation in OR  Informed Consent: I have reviewed the patients History and Physical, chart, labs and discussed the procedure including the risks, benefits and alternatives for the proposed anesthesia with the patient or authorized representative who has indicated his/her understanding and acceptance.     Dental advisory given  Plan Discussed with:   Anesthesia Plan Comments:        Anesthesia Quick  Evaluation

## 2020-04-13 NOTE — Interval H&P Note (Signed)
History and Physical Interval Note:  04/13/2020 10:14 AM  Allen Pierce  has presented today for surgery, with the diagnosis of INGUINAL HERNIA.  The various methods of treatment have been discussed with the patient and family. After consideration of risks, benefits and other options for treatment, the patient has consented to  Procedure(s): OPEN LEFT INGUINAL HERNIA REPAIR WITH MESH (Left) as a surgical intervention.  The patient's history has been reviewed, patient examined, no change in status, stable for surgery.  I have reviewed the patient's chart and labs.  Questions were answered to the patient's satisfaction.     Bess Saltzman Rich Brave

## 2020-04-13 NOTE — Anesthesia Procedure Notes (Signed)
Procedure Name: LMA Insertion Date/Time: 04/13/2020 10:44 AM Performed by: Mechele Claude, CRNA Pre-anesthesia Checklist: Patient identified, Emergency Drugs available, Suction available and Patient being monitored Patient Re-evaluated:Patient Re-evaluated prior to induction Oxygen Delivery Method: Circle system utilized Preoxygenation: Pre-oxygenation with 100% oxygen Induction Type: IV induction Ventilation: Mask ventilation without difficulty LMA: LMA inserted LMA Size: 5.0 Number of attempts: 1 Airway Equipment and Method: Bite block Placement Confirmation: positive ETCO2 Tube secured with: Tape Dental Injury: Teeth and Oropharynx as per pre-operative assessment

## 2020-04-13 NOTE — Op Note (Signed)
Operative Note  Allen Pierce  [id number]  [id number]  04/13/2020   Surgeon: Clovis Riley MD    Assistant: Neysa Bonito MD   Procedure performed: Open left inguinal hernia repair with UltraPro mesh   Preop diagnosis:  left inguinal hernia   Post-op diagnosis/intraop findings: Left inguinal hernia with what appeared to be a sliding component, cord lipoma, significant attenuation of the inguinal floor   Specimens: none   EBL: 5cc   Complications: none   Description of procedure: After obtaining informed consent, the patient was taken to the operating room and placed supine on operating room table where general anesthesia was initiated, preoperative antibiotics were administered, SCDs applied, and a formal timeout was performed.  Foley catheter inserted which is removed at the end of the case.  The groin was clipped, prepped and draped in the usual sterile fashion. An oblique incision was made in the just above the inguinal ligament after infiltrating the tissues with local anesthetic (Exparel mixed with quarter percent Marcaine). Soft tissues were dissected using electrocautery until the external oblique aponeurosis was encountered. This was divided sharply to expand the external ring. A plane was bluntly developed between the spermatic cord and the external oblique. The ilioinguinal nerve was identified and divided between hemostats, each and ligated with 3-0 Vicryl ties. The spermatic cord was then bluntly dissected away from the pubic tubercle and encircled with a Penrose. Inspection of the inguinal anatomy revealed a moderate indirect sac which was densely adherent to the cremasteric tissue and underlying spermatic cord structures. The indirect hernia sac was bluntly dissected away from the cord structures, with some use of cautery where safe given significant adhesions to the cord structures.   Once the sac was completely freed this was reduced into the abdomen.  A moderate amount of  retroperitoneal fat was dissected off of the spermatic cord to confirm structures and to narrow the cord at its exit point from the internal ring.  The inguinal floor was extremely attenuated and essentially absent.  This was reconstructed with interrupted 2-0 Prolene's, narrowing the internal ring to a diameter just sufficient for passage of the spermatic cord structures.  A 3 x 6 piece of ultra Pro mesh was brought onto the field and trimmed to approximate the field.  Interrupted 0 Ethibonds were then used to suture the mesh to the pubic tubercle fascia, the inferior shelving edge and the internal oblique superiorly. The tails of the mesh were wrapped around the spermatic cord, ensuring adequate room for the cord, and sutured to each other with 0 ethibond, and then directed laterally to lie flat beneath the external oblique aponeurosis. Hemostasis was ensured within the wound. The Penrose was removed. The external oblique aponeurosis was reapproximated with a running 3-0 Vicryl to re-create a narrowed external ring. More local was infiltrated around the pubic tubercle and in the plane just below the external oblique. The Scarpa's was reapproximated with interrupted 3-0 Vicryls. The skin was closed with a running subcuticular Monocryl. The remainder of the local was injected in the subcutaneous and subcuticular space. The field was then cleaned, benzoin and Steri-Strips and sterile bandage were applied. Both testicles were palpated in the scrotum at the end of the case. The patient was then awakened extubated and taken to PACU in stable condition.    All counts were correct at the completion of the case

## 2020-04-13 NOTE — Anesthesia Postprocedure Evaluation (Signed)
Anesthesia Post Note  Patient: CAROLL CUNNINGTON  Procedure(s) Performed: OPEN LEFT INGUINAL HERNIA REPAIR WITH MESH (Left Groin)     Patient location during evaluation: PACU Anesthesia Type: General Level of consciousness: awake and alert Pain management: pain level controlled Vital Signs Assessment: post-procedure vital signs reviewed and stable Respiratory status: spontaneous breathing, nonlabored ventilation and respiratory function stable Cardiovascular status: blood pressure returned to baseline and stable Postop Assessment: no apparent nausea or vomiting Anesthetic complications: no   No complications documented.  Last Vitals:  Vitals:   04/13/20 1300 04/13/20 1315  BP: 131/71 131/71  Pulse: 72 70  Resp: 18 13  Temp: 36.6 C   SpO2: 100% 100%    Last Pain:  Vitals:   04/13/20 1315  TempSrc:   PainSc: 0-No pain                 Lidia Collum

## 2020-04-14 ENCOUNTER — Encounter (HOSPITAL_BASED_OUTPATIENT_CLINIC_OR_DEPARTMENT_OTHER): Payer: Self-pay | Admitting: Surgery

## 2020-04-21 DIAGNOSIS — Z08 Encounter for follow-up examination after completed treatment for malignant neoplasm: Secondary | ICD-10-CM | POA: Diagnosis not present

## 2020-04-21 DIAGNOSIS — B9689 Other specified bacterial agents as the cause of diseases classified elsewhere: Secondary | ICD-10-CM | POA: Diagnosis not present

## 2020-04-21 DIAGNOSIS — L0232 Furuncle of buttock: Secondary | ICD-10-CM | POA: Diagnosis not present

## 2020-04-21 DIAGNOSIS — Z85828 Personal history of other malignant neoplasm of skin: Secondary | ICD-10-CM | POA: Diagnosis not present

## 2020-05-05 DIAGNOSIS — E11622 Type 2 diabetes mellitus with other skin ulcer: Secondary | ICD-10-CM | POA: Diagnosis not present

## 2020-05-19 DIAGNOSIS — M545 Low back pain, unspecified: Secondary | ICD-10-CM | POA: Diagnosis not present

## 2020-07-18 DIAGNOSIS — L0232 Furuncle of buttock: Secondary | ICD-10-CM | POA: Diagnosis not present

## 2020-07-18 DIAGNOSIS — B9689 Other specified bacterial agents as the cause of diseases classified elsewhere: Secondary | ICD-10-CM | POA: Diagnosis not present

## 2020-07-18 DIAGNOSIS — L304 Erythema intertrigo: Secondary | ICD-10-CM | POA: Diagnosis not present

## 2020-07-18 DIAGNOSIS — L570 Actinic keratosis: Secondary | ICD-10-CM | POA: Diagnosis not present

## 2020-07-18 DIAGNOSIS — X32XXXD Exposure to sunlight, subsequent encounter: Secondary | ICD-10-CM | POA: Diagnosis not present

## 2020-10-01 DIAGNOSIS — I10 Essential (primary) hypertension: Secondary | ICD-10-CM | POA: Diagnosis not present

## 2020-10-01 DIAGNOSIS — K219 Gastro-esophageal reflux disease without esophagitis: Secondary | ICD-10-CM | POA: Diagnosis not present

## 2020-10-01 DIAGNOSIS — E782 Mixed hyperlipidemia: Secondary | ICD-10-CM | POA: Diagnosis not present

## 2020-10-25 DIAGNOSIS — R7309 Other abnormal glucose: Secondary | ICD-10-CM | POA: Diagnosis not present

## 2020-10-25 DIAGNOSIS — G629 Polyneuropathy, unspecified: Secondary | ICD-10-CM | POA: Diagnosis not present

## 2020-10-25 DIAGNOSIS — I10 Essential (primary) hypertension: Secondary | ICD-10-CM | POA: Diagnosis not present

## 2020-11-01 DIAGNOSIS — K219 Gastro-esophageal reflux disease without esophagitis: Secondary | ICD-10-CM | POA: Diagnosis not present

## 2020-11-01 DIAGNOSIS — E782 Mixed hyperlipidemia: Secondary | ICD-10-CM | POA: Diagnosis not present

## 2020-11-01 DIAGNOSIS — I10 Essential (primary) hypertension: Secondary | ICD-10-CM | POA: Diagnosis not present

## 2020-12-01 DIAGNOSIS — I10 Essential (primary) hypertension: Secondary | ICD-10-CM | POA: Diagnosis not present

## 2020-12-01 DIAGNOSIS — E782 Mixed hyperlipidemia: Secondary | ICD-10-CM | POA: Diagnosis not present

## 2020-12-01 DIAGNOSIS — K219 Gastro-esophageal reflux disease without esophagitis: Secondary | ICD-10-CM | POA: Diagnosis not present

## 2021-01-01 DIAGNOSIS — K219 Gastro-esophageal reflux disease without esophagitis: Secondary | ICD-10-CM | POA: Diagnosis not present

## 2021-01-01 DIAGNOSIS — I10 Essential (primary) hypertension: Secondary | ICD-10-CM | POA: Diagnosis not present

## 2021-01-01 DIAGNOSIS — E782 Mixed hyperlipidemia: Secondary | ICD-10-CM | POA: Diagnosis not present

## 2021-01-30 DIAGNOSIS — I10 Essential (primary) hypertension: Secondary | ICD-10-CM | POA: Diagnosis not present

## 2021-02-01 DIAGNOSIS — K219 Gastro-esophageal reflux disease without esophagitis: Secondary | ICD-10-CM | POA: Diagnosis not present

## 2021-02-01 DIAGNOSIS — I10 Essential (primary) hypertension: Secondary | ICD-10-CM | POA: Diagnosis not present

## 2021-02-01 DIAGNOSIS — E782 Mixed hyperlipidemia: Secondary | ICD-10-CM | POA: Diagnosis not present

## 2021-02-08 DIAGNOSIS — I10 Essential (primary) hypertension: Secondary | ICD-10-CM | POA: Diagnosis not present

## 2021-02-08 DIAGNOSIS — R7303 Prediabetes: Secondary | ICD-10-CM | POA: Diagnosis not present

## 2021-02-08 DIAGNOSIS — E782 Mixed hyperlipidemia: Secondary | ICD-10-CM | POA: Diagnosis not present

## 2021-02-08 DIAGNOSIS — Z125 Encounter for screening for malignant neoplasm of prostate: Secondary | ICD-10-CM | POA: Diagnosis not present

## 2021-02-13 DIAGNOSIS — I10 Essential (primary) hypertension: Secondary | ICD-10-CM | POA: Diagnosis not present

## 2021-02-16 DIAGNOSIS — L570 Actinic keratosis: Secondary | ICD-10-CM | POA: Diagnosis not present

## 2021-02-16 DIAGNOSIS — X32XXXD Exposure to sunlight, subsequent encounter: Secondary | ICD-10-CM | POA: Diagnosis not present

## 2021-02-24 DIAGNOSIS — I10 Essential (primary) hypertension: Secondary | ICD-10-CM | POA: Diagnosis not present

## 2021-02-27 DIAGNOSIS — M109 Gout, unspecified: Secondary | ICD-10-CM | POA: Diagnosis not present

## 2021-02-27 DIAGNOSIS — Z0000 Encounter for general adult medical examination without abnormal findings: Secondary | ICD-10-CM | POA: Diagnosis not present

## 2021-02-27 DIAGNOSIS — N1832 Chronic kidney disease, stage 3b: Secondary | ICD-10-CM | POA: Diagnosis not present

## 2021-02-27 DIAGNOSIS — G629 Polyneuropathy, unspecified: Secondary | ICD-10-CM | POA: Diagnosis not present

## 2021-02-27 DIAGNOSIS — I10 Essential (primary) hypertension: Secondary | ICD-10-CM | POA: Diagnosis not present

## 2021-02-27 DIAGNOSIS — E782 Mixed hyperlipidemia: Secondary | ICD-10-CM | POA: Diagnosis not present

## 2021-02-27 DIAGNOSIS — Z23 Encounter for immunization: Secondary | ICD-10-CM | POA: Diagnosis not present

## 2021-02-27 DIAGNOSIS — K219 Gastro-esophageal reflux disease without esophagitis: Secondary | ICD-10-CM | POA: Diagnosis not present

## 2021-02-27 DIAGNOSIS — M4807 Spinal stenosis, lumbosacral region: Secondary | ICD-10-CM | POA: Diagnosis not present

## 2021-03-02 DIAGNOSIS — X32XXXD Exposure to sunlight, subsequent encounter: Secondary | ICD-10-CM | POA: Diagnosis not present

## 2021-03-02 DIAGNOSIS — L570 Actinic keratosis: Secondary | ICD-10-CM | POA: Diagnosis not present

## 2021-03-03 DIAGNOSIS — I10 Essential (primary) hypertension: Secondary | ICD-10-CM | POA: Diagnosis not present

## 2021-03-03 DIAGNOSIS — K219 Gastro-esophageal reflux disease without esophagitis: Secondary | ICD-10-CM | POA: Diagnosis not present

## 2021-03-03 DIAGNOSIS — E782 Mixed hyperlipidemia: Secondary | ICD-10-CM | POA: Diagnosis not present

## 2021-03-07 ENCOUNTER — Ambulatory Visit (INDEPENDENT_AMBULATORY_CARE_PROVIDER_SITE_OTHER): Payer: PPO

## 2021-03-07 ENCOUNTER — Ambulatory Visit (INDEPENDENT_AMBULATORY_CARE_PROVIDER_SITE_OTHER): Payer: PPO | Admitting: Podiatry

## 2021-03-07 ENCOUNTER — Other Ambulatory Visit: Payer: Self-pay

## 2021-03-07 ENCOUNTER — Encounter: Payer: Self-pay | Admitting: Podiatry

## 2021-03-07 DIAGNOSIS — K133 Hairy leukoplakia: Secondary | ICD-10-CM | POA: Insufficient documentation

## 2021-03-07 DIAGNOSIS — Z0001 Encounter for general adult medical examination with abnormal findings: Secondary | ICD-10-CM | POA: Insufficient documentation

## 2021-03-07 DIAGNOSIS — M79674 Pain in right toe(s): Secondary | ICD-10-CM | POA: Diagnosis not present

## 2021-03-07 DIAGNOSIS — E538 Deficiency of other specified B group vitamins: Secondary | ICD-10-CM | POA: Insufficient documentation

## 2021-03-07 DIAGNOSIS — Z85828 Personal history of other malignant neoplasm of skin: Secondary | ICD-10-CM | POA: Insufficient documentation

## 2021-03-07 DIAGNOSIS — B351 Tinea unguium: Secondary | ICD-10-CM | POA: Diagnosis not present

## 2021-03-07 DIAGNOSIS — M5430 Sciatica, unspecified side: Secondary | ICD-10-CM | POA: Insufficient documentation

## 2021-03-07 DIAGNOSIS — G629 Polyneuropathy, unspecified: Secondary | ICD-10-CM

## 2021-03-07 DIAGNOSIS — M4807 Spinal stenosis, lumbosacral region: Secondary | ICD-10-CM | POA: Insufficient documentation

## 2021-03-07 DIAGNOSIS — R7303 Prediabetes: Secondary | ICD-10-CM | POA: Insufficient documentation

## 2021-03-07 DIAGNOSIS — S90852A Superficial foreign body, left foot, initial encounter: Secondary | ICD-10-CM

## 2021-03-07 DIAGNOSIS — M109 Gout, unspecified: Secondary | ICD-10-CM | POA: Insufficient documentation

## 2021-03-07 DIAGNOSIS — K219 Gastro-esophageal reflux disease without esophagitis: Secondary | ICD-10-CM | POA: Insufficient documentation

## 2021-03-07 DIAGNOSIS — Z7982 Long term (current) use of aspirin: Secondary | ICD-10-CM | POA: Insufficient documentation

## 2021-03-07 DIAGNOSIS — N1832 Chronic kidney disease, stage 3b: Secondary | ICD-10-CM | POA: Insufficient documentation

## 2021-03-07 DIAGNOSIS — N401 Enlarged prostate with lower urinary tract symptoms: Secondary | ICD-10-CM | POA: Insufficient documentation

## 2021-03-07 DIAGNOSIS — H9313 Tinnitus, bilateral: Secondary | ICD-10-CM | POA: Insufficient documentation

## 2021-03-07 DIAGNOSIS — L578 Other skin changes due to chronic exposure to nonionizing radiation: Secondary | ICD-10-CM | POA: Insufficient documentation

## 2021-03-07 DIAGNOSIS — L989 Disorder of the skin and subcutaneous tissue, unspecified: Secondary | ICD-10-CM | POA: Diagnosis not present

## 2021-03-07 DIAGNOSIS — M79675 Pain in left toe(s): Secondary | ICD-10-CM

## 2021-03-07 DIAGNOSIS — Z789 Other specified health status: Secondary | ICD-10-CM | POA: Insufficient documentation

## 2021-03-07 DIAGNOSIS — I10 Essential (primary) hypertension: Secondary | ICD-10-CM | POA: Insufficient documentation

## 2021-03-07 DIAGNOSIS — E785 Hyperlipidemia, unspecified: Secondary | ICD-10-CM | POA: Insufficient documentation

## 2021-03-07 DIAGNOSIS — K648 Other hemorrhoids: Secondary | ICD-10-CM | POA: Insufficient documentation

## 2021-03-07 DIAGNOSIS — E782 Mixed hyperlipidemia: Secondary | ICD-10-CM | POA: Insufficient documentation

## 2021-03-07 DIAGNOSIS — R972 Elevated prostate specific antigen [PSA]: Secondary | ICD-10-CM | POA: Insufficient documentation

## 2021-03-07 DIAGNOSIS — Z87442 Personal history of urinary calculi: Secondary | ICD-10-CM | POA: Insufficient documentation

## 2021-03-07 DIAGNOSIS — D890 Polyclonal hypergammaglobulinemia: Secondary | ICD-10-CM | POA: Insufficient documentation

## 2021-03-07 DIAGNOSIS — Z6829 Body mass index (BMI) 29.0-29.9, adult: Secondary | ICD-10-CM | POA: Insufficient documentation

## 2021-03-10 DIAGNOSIS — I10 Essential (primary) hypertension: Secondary | ICD-10-CM | POA: Diagnosis not present

## 2021-03-12 NOTE — Progress Notes (Signed)
Subjective:   Patient ID: Allen Pierce, male   DOB: 80 y.o.   MRN: [id number]   HPI 80 year old male presents the office today for concerns of a painful skin lesion on his left foot worse than right.  He states on the left foot he stepped on a nail over 20 years ago it is never caused any issue until over the last year he states in the same area started develop a callus.  He states that when he stepped on the nail the nail punctured his foot but it did not break and the entire nail came out.  He has not had any swelling or redness or any drainage or any signs of infection he reports.  The area is tender.  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  Also the nails are thick and discolored he cannot do them himself not causing pain.   Review of Systems  All other systems reviewed and are negative.  Past Medical History:  Diagnosis Date   A-fib [facility])    no cardiologist occ heard by dr pharr   AP (abdominal pain)    Back pain    bad disc   Dysrhythmia    occ followed by dr Shelia Media   GERD (gastroesophageal reflux disease)    Hemorrhoids    History of gout    History of hiatal hernia    History of kidney stones    HLD (hyperlipidemia)    HTN (hypertension)    Inguinal hernia    Melanoma ([place]) 2017, 2021   skin cancer ears x 2, early skin cancer removed from forehead dr hall Mar 24 2020   Rash 03/2020   buttock area using cream healing    Right inguinal hernia    Sleep apnea    discontinued cpap 2011 due to no longer needed per md    Past Surgical History:  Procedure Laterality Date   COLONOSCOPY     x 2 along with endoscopy each time    DIAGNOSTIC LAPAROSCOPY  2018   HERNIA REPAIR     Right   INGUINAL HERNIA REPAIR Right 08/17/2016   Procedure: LAPAROSCOPIC RIGHT INGUINAL HERNIA REPAIR;  Surgeon: Clovis Riley, MD;  Location: [facility];  Service: General;  Laterality: Right;   INGUINAL HERNIA REPAIR  Right 01/25/2017   Procedure: RIGHT INGUINAL HERNIA REPAIR WITH MESH;  Surgeon: Clovis Riley, MD;  Location: [facility];  Service: General;  Laterality: Right;   INGUINAL HERNIA REPAIR Left 04/13/2020   Procedure: OPEN LEFT INGUINAL HERNIA REPAIR WITH MESH;  Surgeon: Clovis Riley, MD;  Location: [facility];  Service: General;  Laterality: Left;   INSERTION OF MESH Right 01/25/2017   Procedure: INSERTION OF MESH;  Surgeon: Clovis Riley, MD;  Location: [facility];  Service: General;  Laterality: Right;   LAPAROSCOPIC REMOVAL OF MESENTERIC MASS N/A 08/17/2016   Procedure: BIOPSY OF MESENTERIC MASS;  Surgeon: Clovis Riley, MD;  Location: [facility];  Service: General;  Laterality: N/A;     Current Outpatient Medications:    clotrimazole (LOTRIMIN) 1 % cream, 1 application, Disp: , Rfl:    hydrALAZINE (APRESOLINE) 25 MG tablet, 1 tablet with food, Disp: , Rfl:    traMADol (ULTRAM) 50 MG tablet, 1 to 2 tablets, Disp: , Rfl:    acetaminophen (TYLENOL) 500 MG tablet, 1 tablet as needed, Disp: , Rfl:    allopurinol (ZYLOPRIM)  300 MG tablet, Take 1 tablet by mouth daily., Disp: , Rfl:    aspirin 81 MG chewable tablet, 1 tablet, Disp: , Rfl:    B-Complex TABS, See admin instructions., Disp: , Rfl:    Cholecalciferol (VITAMIN D3) 50 MCG (2000 UT) capsule, 1 capsule, Disp: , Rfl:    Cyanocobalamin (VITAMIN B 12 PO), Take by mouth. 1000 mcg daily, Disp: , Rfl:    famotidine (PEPCID) 20 MG tablet, 1 tablet at bedtime as needed, Disp: , Rfl:    gabapentin (NEURONTIN) 300 MG capsule, TAKE 1 CAPSULE BY MOUTH twicea day, Disp: , Rfl:    ibuprofen (ADVIL) 200 MG tablet, Take 200 mg by mouth every 6 (six) hours as needed. Takes  400 mg prn, Disp: , Rfl:    metoprolol (LOPRESSOR) 50 MG tablet, Take 25 mg by mouth 2 (two) times daily. , Disp: , Rfl:    Multiple Vitamins-Minerals (AIRBORNE) CHEW, See admin instructions., Disp: , Rfl:    naproxen sodium (ANAPROX) 220 MG tablet, Take 220 mg by  mouth 2 (two) times daily as needed. aleve, Disp: , Rfl:    OVER THE COUNTER MEDICATION, Super c/vit d/ zinc daily, Disp: , Rfl:    OVER THE COUNTER MEDICATION, Vitamin d 3 25 mg daily, Disp: , Rfl:    potassium chloride (KLOR-CON) 10 MEQ tablet, Take 10 mEq by mouth every other day., Disp: , Rfl:    rosuvastatin (CRESTOR) 10 MG tablet, Take 20 mg by mouth every other day. At qhs, Disp: , Rfl:    Turmeric (QC TUMERIC COMPLEX) 500 MG CAPS, Take by mouth daily., Disp: , Rfl:    Zinc 50 MG TABS, 1 tablet, Disp: , Rfl:   Allergies  Allergen Reactions   Lisinopril Cough          Objective:  Physical Exam  General: AAO x3, NAD  Dermatological: Punctate annular hyperkeratotic lesion right foot submetatarsal 5 without any underlying ulceration, drainage or any signs of infection.  On the left foot submetatarsal 2 is a thick hyperkeratotic lesion approximately 1.4 cm in diameter.  Upon debridement there is no underlying ulceration drainage or any signs of infection.  No evidence of any puncture wound.  There is no surrounding erythema, ascending cellulitis.  There is no fluctuance crepitation.  There is no malodor.  Nails are hypertrophic, dystrophic with yellow-brown discoloration with subungual debris.  No edema, erythema or signs of infection.  Tenderness nails 1-5 bilaterally.  Vascular: Dorsalis Pedis artery and Posterior Tibial artery pedal pulses are 2/4 bilateral with immedate capillary fill time. There is no pain with calf compression, swelling, warmth, erythema.   Neruologic: Sensation decreased with Semmes Weinstein monofilament  Musculoskeletal: Prominent metatarsal heads plantarly.  Tenderness on the hyperkeratotic lesions.  No other areas of discomfort.  Muscular strength 5/5 in all groups tested bilateral.  Gait: Unassisted, Nonantalgic.       Assessment:   80 year old male with preulcerative thickened hyperkeratotic lesions bilaterally; neuropathy; symptomatic  onychomycosis     Plan:  -Treatment options discussed including all alternatives, risks, and complications -Etiology of symptoms were discussed -X-rays obtained and reviewed.  There is no evidence of acute fracture, foreign body. -Sharply debrided the hyperkeratotic lesions x2 without any complications or bleeding.  Recommend moisturizer and offloading daily. -Sharply debrided nails x10 without any complications or bleeding.  Return in about 3 months (around 06/07/2021).    Trula Slade DPM

## 2021-04-06 DIAGNOSIS — G629 Polyneuropathy, unspecified: Secondary | ICD-10-CM | POA: Diagnosis not present

## 2021-04-06 DIAGNOSIS — I10 Essential (primary) hypertension: Secondary | ICD-10-CM | POA: Diagnosis not present

## 2021-04-17 DIAGNOSIS — I10 Essential (primary) hypertension: Secondary | ICD-10-CM | POA: Diagnosis not present

## 2021-04-26 DIAGNOSIS — I10 Essential (primary) hypertension: Secondary | ICD-10-CM | POA: Diagnosis not present

## 2021-05-03 DIAGNOSIS — I10 Essential (primary) hypertension: Secondary | ICD-10-CM | POA: Diagnosis not present

## 2021-05-03 DIAGNOSIS — K219 Gastro-esophageal reflux disease without esophagitis: Secondary | ICD-10-CM | POA: Diagnosis not present

## 2021-05-03 DIAGNOSIS — E782 Mixed hyperlipidemia: Secondary | ICD-10-CM | POA: Diagnosis not present

## 2021-05-25 DIAGNOSIS — I10 Essential (primary) hypertension: Secondary | ICD-10-CM | POA: Diagnosis not present

## 2021-06-01 DIAGNOSIS — I10 Essential (primary) hypertension: Secondary | ICD-10-CM | POA: Diagnosis not present

## 2021-06-02 DIAGNOSIS — I10 Essential (primary) hypertension: Secondary | ICD-10-CM | POA: Diagnosis not present

## 2021-06-02 DIAGNOSIS — E782 Mixed hyperlipidemia: Secondary | ICD-10-CM | POA: Diagnosis not present

## 2021-06-02 DIAGNOSIS — K219 Gastro-esophageal reflux disease without esophagitis: Secondary | ICD-10-CM | POA: Diagnosis not present

## 2021-06-07 ENCOUNTER — Ambulatory Visit: Payer: PPO | Admitting: Podiatry

## 2021-06-07 ENCOUNTER — Encounter: Payer: Self-pay | Admitting: Podiatry

## 2021-06-07 ENCOUNTER — Other Ambulatory Visit: Payer: Self-pay

## 2021-06-07 DIAGNOSIS — Q828 Other specified congenital malformations of skin: Secondary | ICD-10-CM | POA: Diagnosis not present

## 2021-06-07 DIAGNOSIS — B351 Tinea unguium: Secondary | ICD-10-CM

## 2021-06-07 DIAGNOSIS — M79674 Pain in right toe(s): Secondary | ICD-10-CM | POA: Diagnosis not present

## 2021-06-07 DIAGNOSIS — M79675 Pain in left toe(s): Secondary | ICD-10-CM | POA: Diagnosis not present

## 2021-06-07 NOTE — Progress Notes (Signed)
This patient returns to my office for at risk foot care.  This patient requires this care by a professional since this patient will be at risk due to having polyneuropathy and CKD. He has painful callus left forefoot due to FB penetration.  This patient is unable to cut nails himself since the patient cannot reach his nails.These nails are painful walking and wearing shoes. Painful callus sub 2 left foot. This patient presents for at risk foot care today.  General Appearance  Alert, conversant and in no acute stress.  Vascular  Dorsalis pedis and posterior tibial  pulses are palpable  bilaterally.  Capillary return is within normal limits  bilaterally. Temperature is within normal limits  bilaterally.  Neurologic  Senn-Weinstein monofilament wire test within normal limits  bilaterally. Muscle power within normal limits bilaterally.  Nails Thick disfigured discolored nails with subungual debris  from hallux to fifth toes bilaterally. No evidence of bacterial infection or drainage bilaterally.  Orthopedic  No limitations of motion  feet .  No crepitus or effusions noted.  No bony pathology or digital deformities noted.  Skin  normotropic skin with no porokeratosis noted bilaterally.  No signs of infections or ulcers noted.     Onychomycosis  Pain in right toes  Pain in left toes  Consent was obtained for treatment procedures.   Mechanical debridement of nails 1-5  bilaterally performed with a nail nipper.  Filed with dremel without incident. Debridement of callus with # 15 blade.  Padding applied to shoe.   Return office visit                     Told patient to return for periodic foot care and evaluation due to potential at risk complications.   Gardiner Barefoot DPM

## 2021-06-23 DIAGNOSIS — I10 Essential (primary) hypertension: Secondary | ICD-10-CM | POA: Diagnosis not present

## 2021-06-23 DIAGNOSIS — N1832 Chronic kidney disease, stage 3b: Secondary | ICD-10-CM | POA: Diagnosis not present

## 2021-06-23 DIAGNOSIS — R5383 Other fatigue: Secondary | ICD-10-CM | POA: Diagnosis not present

## 2021-06-23 DIAGNOSIS — R001 Bradycardia, unspecified: Secondary | ICD-10-CM | POA: Diagnosis not present

## 2021-06-23 DIAGNOSIS — E538 Deficiency of other specified B group vitamins: Secondary | ICD-10-CM | POA: Diagnosis not present

## 2021-07-04 DIAGNOSIS — E782 Mixed hyperlipidemia: Secondary | ICD-10-CM | POA: Diagnosis not present

## 2021-07-04 DIAGNOSIS — K219 Gastro-esophageal reflux disease without esophagitis: Secondary | ICD-10-CM | POA: Diagnosis not present

## 2021-07-04 DIAGNOSIS — I10 Essential (primary) hypertension: Secondary | ICD-10-CM | POA: Diagnosis not present

## 2021-07-10 DIAGNOSIS — I10 Essential (primary) hypertension: Secondary | ICD-10-CM | POA: Diagnosis not present

## 2021-07-10 DIAGNOSIS — R002 Palpitations: Secondary | ICD-10-CM | POA: Diagnosis not present

## 2021-07-10 DIAGNOSIS — I129 Hypertensive chronic kidney disease with stage 1 through stage 4 chronic kidney disease, or unspecified chronic kidney disease: Secondary | ICD-10-CM | POA: Diagnosis not present

## 2021-07-10 DIAGNOSIS — N183 Chronic kidney disease, stage 3 unspecified: Secondary | ICD-10-CM | POA: Diagnosis not present

## 2021-07-10 DIAGNOSIS — Z87442 Personal history of urinary calculi: Secondary | ICD-10-CM | POA: Diagnosis not present

## 2021-07-11 ENCOUNTER — Other Ambulatory Visit (HOSPITAL_COMMUNITY): Payer: Self-pay | Admitting: Nephrology

## 2021-07-11 DIAGNOSIS — N1832 Chronic kidney disease, stage 3b: Secondary | ICD-10-CM

## 2021-07-21 ENCOUNTER — Other Ambulatory Visit: Payer: Self-pay

## 2021-07-21 ENCOUNTER — Ambulatory Visit (HOSPITAL_COMMUNITY)
Admission: RE | Admit: 2021-07-21 | Discharge: 2021-07-21 | Disposition: A | Discharge status: Home / Self Care | Payer: PPO | Source: Ambulatory Visit | Attending: Nephrology | Admitting: Nephrology

## 2021-07-21 DIAGNOSIS — N1832 Chronic kidney disease, stage 3b: Secondary | ICD-10-CM | POA: Insufficient documentation

## 2021-07-21 DIAGNOSIS — N281 Cyst of kidney, acquired: Secondary | ICD-10-CM | POA: Diagnosis not present

## 2021-07-21 DIAGNOSIS — N323 Diverticulum of bladder: Secondary | ICD-10-CM | POA: Diagnosis not present

## 2021-07-27 DIAGNOSIS — R002 Palpitations: Secondary | ICD-10-CM | POA: Diagnosis not present

## 2021-07-31 DIAGNOSIS — K219 Gastro-esophageal reflux disease without esophagitis: Secondary | ICD-10-CM | POA: Diagnosis not present

## 2021-07-31 DIAGNOSIS — E782 Mixed hyperlipidemia: Secondary | ICD-10-CM | POA: Diagnosis not present

## 2021-07-31 DIAGNOSIS — I10 Essential (primary) hypertension: Secondary | ICD-10-CM | POA: Diagnosis not present

## 2021-08-01 DIAGNOSIS — I10 Essential (primary) hypertension: Secondary | ICD-10-CM | POA: Diagnosis not present

## 2021-08-31 DIAGNOSIS — I10 Essential (primary) hypertension: Secondary | ICD-10-CM | POA: Diagnosis not present

## 2021-08-31 DIAGNOSIS — Z79899 Other long term (current) drug therapy: Secondary | ICD-10-CM | POA: Diagnosis not present

## 2021-09-01 DIAGNOSIS — E782 Mixed hyperlipidemia: Secondary | ICD-10-CM | POA: Diagnosis not present

## 2021-09-01 DIAGNOSIS — I10 Essential (primary) hypertension: Secondary | ICD-10-CM | POA: Diagnosis not present

## 2021-09-01 DIAGNOSIS — K219 Gastro-esophageal reflux disease without esophagitis: Secondary | ICD-10-CM | POA: Diagnosis not present

## 2021-09-06 ENCOUNTER — Ambulatory Visit: Payer: PPO | Admitting: Podiatry

## 2021-09-11 ENCOUNTER — Encounter: Payer: Self-pay | Admitting: Podiatry

## 2021-09-11 ENCOUNTER — Ambulatory Visit (INDEPENDENT_AMBULATORY_CARE_PROVIDER_SITE_OTHER): Payer: PPO | Admitting: Podiatry

## 2021-09-11 DIAGNOSIS — Q828 Other specified congenital malformations of skin: Secondary | ICD-10-CM | POA: Diagnosis not present

## 2021-09-11 DIAGNOSIS — M79675 Pain in left toe(s): Secondary | ICD-10-CM | POA: Diagnosis not present

## 2021-09-11 DIAGNOSIS — G629 Polyneuropathy, unspecified: Secondary | ICD-10-CM

## 2021-09-11 DIAGNOSIS — M79674 Pain in right toe(s): Secondary | ICD-10-CM | POA: Diagnosis not present

## 2021-09-11 DIAGNOSIS — B351 Tinea unguium: Secondary | ICD-10-CM | POA: Diagnosis not present

## 2021-09-11 NOTE — Progress Notes (Signed)
This patient returns to my office for at risk foot care.  This patient requires this care by a professional since this patient will be at risk due to having polyneuropathy and CKD. He has painful callus left forefoot due to FB penetration.  This patient is unable to cut nails himself since the patient cannot reach his nails.These nails are painful walking and wearing shoes. Painful callus sub 2 left foot. This patient presents for at risk foot care today.  General Appearance  Alert, conversant and in no acute stress.  Vascular  Dorsalis pedis and posterior tibial  pulses are palpable  bilaterally.  Capillary return is within normal limits  bilaterally. Temperature is within normal limits  bilaterally.  Neurologic  Senn-Weinstein monofilament wire test within normal limits  bilaterally. Muscle power within normal limits bilaterally.  Nails Thick disfigured discolored nails with subungual debris  from hallux to fifth toes bilaterally. No evidence of bacterial infection or drainage bilaterally.  Orthopedic  No limitations of motion  feet .  No crepitus or effusions noted.  No bony pathology or digital deformities noted.  Skin  normotropic skin with no porokeratosis noted bilaterally.  No signs of infections or ulcers noted.     Onychomycosis  Pain in right toes  Pain in left toes  Consent was obtained for treatment procedures.   Mechanical debridement of nails 1-5  bilaterally performed with a nail nipper.  Filed with dremel without incident. Debridement of callus with # 15 blade.    Return office visit   3 months                  Told patient to return for periodic foot care and evaluation due to potential at risk complications.   Lazette Estala DPM   

## 2021-09-20 DIAGNOSIS — I10 Essential (primary) hypertension: Secondary | ICD-10-CM | POA: Diagnosis not present

## 2021-09-20 DIAGNOSIS — G629 Polyneuropathy, unspecified: Secondary | ICD-10-CM | POA: Diagnosis not present

## 2021-10-09 DIAGNOSIS — N183 Chronic kidney disease, stage 3 unspecified: Secondary | ICD-10-CM | POA: Diagnosis not present

## 2021-10-09 DIAGNOSIS — I10 Essential (primary) hypertension: Secondary | ICD-10-CM | POA: Diagnosis not present

## 2021-10-09 DIAGNOSIS — I129 Hypertensive chronic kidney disease with stage 1 through stage 4 chronic kidney disease, or unspecified chronic kidney disease: Secondary | ICD-10-CM | POA: Diagnosis not present

## 2021-10-09 DIAGNOSIS — Z87442 Personal history of urinary calculi: Secondary | ICD-10-CM | POA: Diagnosis not present

## 2021-11-14 ENCOUNTER — Ambulatory Visit: Payer: Self-pay

## 2021-11-14 ENCOUNTER — Ambulatory Visit (INDEPENDENT_AMBULATORY_CARE_PROVIDER_SITE_OTHER): Payer: PPO | Admitting: Family Medicine

## 2021-11-14 ENCOUNTER — Ambulatory Visit (INDEPENDENT_AMBULATORY_CARE_PROVIDER_SITE_OTHER): Payer: PPO

## 2021-11-14 VITALS — BP 118/74 | HR 49 | Ht 73.0 in | Wt 212.0 lb

## 2021-11-14 DIAGNOSIS — M25511 Pain in right shoulder: Secondary | ICD-10-CM | POA: Diagnosis not present

## 2021-11-14 DIAGNOSIS — G8929 Other chronic pain: Secondary | ICD-10-CM

## 2021-11-14 DIAGNOSIS — M19011 Primary osteoarthritis, right shoulder: Secondary | ICD-10-CM | POA: Diagnosis not present

## 2021-11-14 NOTE — Progress Notes (Signed)
I, Peterson Lombard, LAT, ATC acting as a scribe for Lynne Leader, MD.  Subjective:    CC: R shoulder pain  HPI: Pt is a 81 y/o male c/o R shoulder pain x 6-8 months w/ no MOI. Pt locates pain to all over the R Jennette joint. Pt is L-hand dominate. No injury history.   Neck pain: no Radiates: no UE numbness/tingling: no UE weakness: no Aggravates: shoulder flex and abd Treatments tried: horse liniment, theraworks  Pertinent review of Systems: no fever or chills.   Relevant historical information: CKD3   Objective:    Vitals:   11/14/21 1246  BP: 118/74  Pulse: (!) 49  SpO2: 98%   General: Well Developed, well nourished, and in no acute distress.   MSK: Right shoulder: Normal. Significantly reduced range of motion.  Abduction 130 degrees.  External rotation 10 degrees beyond neutral position.  Functional internal rotation posterior iliac crest. Strength abduction 4/5 external rotation 4/5 internal rotation 4/5 Positive Hawkins and Neer's test.    Lab and Radiology Results  Procedure: Real-time Ultrasound Guided Injection of right shoulder glenohumeral joint posterior approach Device: Philips Affiniti 50G Images permanently stored and available for review in PACS Ultrasound evaluation reveals anterior subdeltoid bursitis.  Difficult to visualize subscapularis rotator cuff tendon due to inadequate external rotation positioning.  Supraspinatus tendon does appear to be intact. Significant degenerative appearing posterior glenohumeral joint. Infraspinatus tendon appears to be intact. AC joint DJD present. Verbal informed consent obtained.  Discussed risks and benefits of procedure. Warned about infection, bleeding, hyperglycemia damage to structures among others. Patient expresses understanding and agreement Time-out conducted.   Noted no overlying erythema, induration, or other signs of local infection.   Skin prepped in a sterile fashion.   Local anesthesia: Topical  Ethyl chloride.   With sterile technique and under real time ultrasound guidance: 40 mg of Kenalog and 2 mL of Marcaine injected into glenohumeral joint. Fluid seen entering the joint capsule.   Completed without difficulty   Pain moderately resolved suggesting accurate placement of the medication.   Advised to call if fevers/chills, erythema, induration, drainage, or persistent bleeding.   Images permanently stored and available for review in the ultrasound unit.  Impression: Technically successful ultrasound guided injection.  X-ray images right shoulder obtained today personally and independently interpreted Significant degenerative changes with large inferior glenoid spur. Loose bodies present within the glenohumeral joint. AC DJD present.  No acute fractures are present. Await formal radiology review     Impression and Recommendations:    Assessment and Plan: 81 y.o. male with right shoulder pain.  Pain very likely due to the significant degenerative changes seen on x-ray.  He likely does have some tendinopathy and bursitis to explain some pain as well but the degenerative changes are very significant.  Hopefully he will get some benefit from the traction long-term.  If not total shoulder replacement is probably his next best option.  Check back in 2 months or sooner if needed.  Keep me updated.  PDMP not reviewed this encounter. Orders Placed This Encounter  Procedures   [place] LIMITED JOINT SPACE STRUCTURES UP RIGHT(NO LINKED CHARGES)    Order Specific Question:   Reason for Exam (SYMPTOM  OR DIAGNOSIS REQUIRED)    Answer:   right shoulder pain    Order Specific Question:   Preferred imaging location?    Answer:   [place]   DG Shoulder Right    Standing Status:   Future  Number of Occurrences:   1    Standing Expiration Date:   11/15/2022    Order Specific Question:   Reason for Exam (SYMPTOM  OR DIAGNOSIS REQUIRED)    Answer:   right shoulder  pain    Order Specific Question:   Preferred imaging location?    Answer:   [facility]   No orders of the defined types were placed in this encounter.   Discussed warning signs or symptoms. Please see discharge instructions. Patient expresses understanding.   The above documentation has been reviewed and is accurate and complete Lynne Leader, M.D.

## 2021-11-14 NOTE — Patient Instructions (Addendum)
Thank you for coming in today.   Please get an Xray today before you leave   You received a steroid injection in your right shoulder today. Seek immediate medical attention if the joint becomes red, extremely painful, or is oozing fluid.   Please use Voltaren gel (Generic Diclofenac Gel) up to 4x daily for pain as needed.  This is available over-the-counter as both the name brand Voltaren gel and the generic diclofenac gel.   Check back in 2 months

## 2021-11-15 NOTE — Progress Notes (Signed)
Right shoulder x-ray shows severe arthritis.  This is likely the source of your pain.

## 2021-11-27 DIAGNOSIS — X32XXXD Exposure to sunlight, subsequent encounter: Secondary | ICD-10-CM | POA: Diagnosis not present

## 2021-11-27 DIAGNOSIS — D044 Carcinoma in situ of skin of scalp and neck: Secondary | ICD-10-CM | POA: Diagnosis not present

## 2021-11-27 DIAGNOSIS — L570 Actinic keratosis: Secondary | ICD-10-CM | POA: Diagnosis not present

## 2021-11-27 DIAGNOSIS — L304 Erythema intertrigo: Secondary | ICD-10-CM | POA: Diagnosis not present

## 2021-12-01 DIAGNOSIS — I10 Essential (primary) hypertension: Secondary | ICD-10-CM | POA: Diagnosis not present

## 2021-12-01 DIAGNOSIS — N1832 Chronic kidney disease, stage 3b: Secondary | ICD-10-CM | POA: Diagnosis not present

## 2021-12-11 ENCOUNTER — Encounter: Payer: Self-pay | Admitting: Podiatry

## 2021-12-11 ENCOUNTER — Ambulatory Visit: Payer: PPO | Admitting: Podiatry

## 2021-12-11 DIAGNOSIS — B351 Tinea unguium: Secondary | ICD-10-CM | POA: Diagnosis not present

## 2021-12-11 DIAGNOSIS — G629 Polyneuropathy, unspecified: Secondary | ICD-10-CM | POA: Diagnosis not present

## 2021-12-11 DIAGNOSIS — M79674 Pain in right toe(s): Secondary | ICD-10-CM

## 2021-12-11 DIAGNOSIS — Q828 Other specified congenital malformations of skin: Secondary | ICD-10-CM | POA: Diagnosis not present

## 2021-12-11 DIAGNOSIS — M79675 Pain in left toe(s): Secondary | ICD-10-CM | POA: Diagnosis not present

## 2021-12-11 NOTE — Progress Notes (Signed)
This patient returns to my office for at risk foot care.  This patient requires this care by a professional since this patient will be at risk due to having polyneuropathy and CKD. He has painful callus left forefoot due to FB penetration.  This patient is unable to cut nails himself since the patient cannot reach his nails.These nails are painful walking and wearing shoes. Painful callus sub 2 left foot. This patient presents for at risk foot care today.  General Appearance  Alert, conversant and in no acute stress.  Vascular  Dorsalis pedis and posterior tibial  pulses are palpable  bilaterally.  Capillary return is within normal limits  bilaterally. Temperature is within normal limits  bilaterally.  Neurologic  Senn-Weinstein monofilament wire test within normal limits  bilaterally. Muscle power within normal limits bilaterally.  Nails Thick disfigured discolored nails with subungual debris  from hallux to fifth toes bilaterally. No evidence of bacterial infection or drainage bilaterally.  Orthopedic  No limitations of motion  feet .  No crepitus or effusions noted.  No bony pathology or digital deformities noted.  Skin  normotropic skin with no porokeratosis noted bilaterally.  No signs of infections or ulcers noted.     Onychomycosis  Pain in right toes  Pain in left toes  Consent was obtained for treatment procedures.   Mechanical debridement of nails 1-5  bilaterally performed with a nail nipper.  Filed with dremel without incident. Debridement of callus with # 15 blade.    Return office visit   3 months                  Told patient to return for periodic foot care and evaluation due to potential at risk complications.   Gardiner Barefoot DPM

## 2022-01-01 DIAGNOSIS — N1832 Chronic kidney disease, stage 3b: Secondary | ICD-10-CM | POA: Diagnosis not present

## 2022-01-01 DIAGNOSIS — I10 Essential (primary) hypertension: Secondary | ICD-10-CM | POA: Diagnosis not present

## 2022-01-08 DIAGNOSIS — X32XXXD Exposure to sunlight, subsequent encounter: Secondary | ICD-10-CM | POA: Diagnosis not present

## 2022-01-08 DIAGNOSIS — Z85828 Personal history of other malignant neoplasm of skin: Secondary | ICD-10-CM | POA: Diagnosis not present

## 2022-01-08 DIAGNOSIS — L570 Actinic keratosis: Secondary | ICD-10-CM | POA: Diagnosis not present

## 2022-01-08 DIAGNOSIS — C44212 Basal cell carcinoma of skin of right ear and external auricular canal: Secondary | ICD-10-CM | POA: Diagnosis not present

## 2022-01-08 DIAGNOSIS — D0439 Carcinoma in situ of skin of other parts of face: Secondary | ICD-10-CM | POA: Diagnosis not present

## 2022-01-08 DIAGNOSIS — L304 Erythema intertrigo: Secondary | ICD-10-CM | POA: Diagnosis not present

## 2022-01-08 DIAGNOSIS — Z08 Encounter for follow-up examination after completed treatment for malignant neoplasm: Secondary | ICD-10-CM | POA: Diagnosis not present

## 2022-02-19 DIAGNOSIS — Z08 Encounter for follow-up examination after completed treatment for malignant neoplasm: Secondary | ICD-10-CM | POA: Diagnosis not present

## 2022-02-19 DIAGNOSIS — Z85828 Personal history of other malignant neoplasm of skin: Secondary | ICD-10-CM | POA: Diagnosis not present

## 2022-02-26 DIAGNOSIS — R7303 Prediabetes: Secondary | ICD-10-CM | POA: Diagnosis not present

## 2022-02-26 DIAGNOSIS — I10 Essential (primary) hypertension: Secondary | ICD-10-CM | POA: Diagnosis not present

## 2022-02-26 DIAGNOSIS — E538 Deficiency of other specified B group vitamins: Secondary | ICD-10-CM | POA: Diagnosis not present

## 2022-02-26 DIAGNOSIS — N1832 Chronic kidney disease, stage 3b: Secondary | ICD-10-CM | POA: Diagnosis not present

## 2022-02-26 DIAGNOSIS — R413 Other amnesia: Secondary | ICD-10-CM | POA: Diagnosis not present

## 2022-02-26 DIAGNOSIS — Z125 Encounter for screening for malignant neoplasm of prostate: Secondary | ICD-10-CM | POA: Diagnosis not present

## 2022-02-26 DIAGNOSIS — D890 Polyclonal hypergammaglobulinemia: Secondary | ICD-10-CM | POA: Diagnosis not present

## 2022-02-26 DIAGNOSIS — E782 Mixed hyperlipidemia: Secondary | ICD-10-CM | POA: Diagnosis not present

## 2022-03-01 DIAGNOSIS — R351 Nocturia: Secondary | ICD-10-CM | POA: Diagnosis not present

## 2022-03-01 DIAGNOSIS — E782 Mixed hyperlipidemia: Secondary | ICD-10-CM | POA: Diagnosis not present

## 2022-03-01 DIAGNOSIS — R413 Other amnesia: Secondary | ICD-10-CM | POA: Diagnosis not present

## 2022-03-01 DIAGNOSIS — E538 Deficiency of other specified B group vitamins: Secondary | ICD-10-CM | POA: Diagnosis not present

## 2022-03-01 DIAGNOSIS — I700 Atherosclerosis of aorta: Secondary | ICD-10-CM | POA: Diagnosis not present

## 2022-03-01 DIAGNOSIS — R7303 Prediabetes: Secondary | ICD-10-CM | POA: Diagnosis not present

## 2022-03-01 DIAGNOSIS — Z0000 Encounter for general adult medical examination without abnormal findings: Secondary | ICD-10-CM | POA: Diagnosis not present

## 2022-03-01 DIAGNOSIS — E876 Hypokalemia: Secondary | ICD-10-CM | POA: Diagnosis not present

## 2022-03-01 DIAGNOSIS — Z23 Encounter for immunization: Secondary | ICD-10-CM | POA: Diagnosis not present

## 2022-03-01 DIAGNOSIS — D890 Polyclonal hypergammaglobulinemia: Secondary | ICD-10-CM | POA: Diagnosis not present

## 2022-03-01 DIAGNOSIS — M109 Gout, unspecified: Secondary | ICD-10-CM | POA: Diagnosis not present

## 2022-03-01 DIAGNOSIS — M25511 Pain in right shoulder: Secondary | ICD-10-CM | POA: Diagnosis not present

## 2022-03-01 DIAGNOSIS — I10 Essential (primary) hypertension: Secondary | ICD-10-CM | POA: Diagnosis not present

## 2022-03-01 DIAGNOSIS — N1832 Chronic kidney disease, stage 3b: Secondary | ICD-10-CM | POA: Diagnosis not present

## 2022-03-02 ENCOUNTER — Other Ambulatory Visit (HOSPITAL_COMMUNITY): Payer: Self-pay | Admitting: Internal Medicine

## 2022-03-02 ENCOUNTER — Other Ambulatory Visit: Payer: Self-pay | Admitting: Internal Medicine

## 2022-03-02 DIAGNOSIS — I700 Atherosclerosis of aorta: Secondary | ICD-10-CM

## 2022-03-12 ENCOUNTER — Ambulatory Visit: Payer: PPO | Admitting: Podiatry

## 2022-03-12 ENCOUNTER — Encounter: Payer: Self-pay | Admitting: Podiatry

## 2022-03-12 DIAGNOSIS — Q828 Other specified congenital malformations of skin: Secondary | ICD-10-CM

## 2022-03-12 DIAGNOSIS — N1832 Chronic kidney disease, stage 3b: Secondary | ICD-10-CM

## 2022-03-12 DIAGNOSIS — L989 Disorder of the skin and subcutaneous tissue, unspecified: Secondary | ICD-10-CM

## 2022-03-12 DIAGNOSIS — M79674 Pain in right toe(s): Secondary | ICD-10-CM

## 2022-03-12 DIAGNOSIS — B351 Tinea unguium: Secondary | ICD-10-CM | POA: Diagnosis not present

## 2022-03-12 DIAGNOSIS — M79675 Pain in left toe(s): Secondary | ICD-10-CM

## 2022-03-12 DIAGNOSIS — G629 Polyneuropathy, unspecified: Secondary | ICD-10-CM

## 2022-03-12 NOTE — Progress Notes (Signed)
This patient returns to my office for at risk foot care.  This patient requires this care by a professional since this patient will be at risk due to having polyneuropathy and CKD. He has painful callus left forefoot due to FB penetration.  This patient is unable to cut nails himself since the patient cannot reach his nails.These nails are painful walking and wearing shoes. Painful callus sub 2 left foot. This patient presents for at risk foot care today.  General Appearance  Alert, conversant and in no acute stress.  Vascular  Dorsalis pedis and posterior tibial  pulses are palpable  bilaterally.  Capillary return is within normal limits  bilaterally. Temperature is within normal limits  bilaterally.  Neurologic  Senn-Weinstein monofilament wire test within normal limits  bilaterally. Muscle power within normal limits bilaterally.  Nails Thick disfigured discolored nails with subungual debris  from hallux to fifth toes bilaterally. No evidence of bacterial infection or drainage bilaterally.  Orthopedic  No limitations of motion  feet .  No crepitus or effusions noted.  No bony pathology or digital deformities noted.  Skin  normotropic skin with no porokeratosis noted bilaterally.  No signs of infections or ulcers noted.     Onychomycosis  Pain in right toes  Pain in left toes  Consent was obtained for treatment procedures.   Mechanical debridement of nails 1-5  bilaterally performed with a nail nipper.  Filed with dremel without incident. Debridement of callus with # 15 blade.    Return office visit   3 months                  Told patient to return for periodic foot care and evaluation due to potential at risk complications.   Gardiner Barefoot DPM

## 2022-03-21 DIAGNOSIS — R600 Localized edema: Secondary | ICD-10-CM | POA: Diagnosis not present

## 2022-03-21 DIAGNOSIS — E876 Hypokalemia: Secondary | ICD-10-CM | POA: Diagnosis not present

## 2022-03-21 DIAGNOSIS — I10 Essential (primary) hypertension: Secondary | ICD-10-CM | POA: Diagnosis not present

## 2022-03-22 ENCOUNTER — Other Ambulatory Visit (HOSPITAL_COMMUNITY): Payer: Self-pay | Admitting: Internal Medicine

## 2022-03-22 DIAGNOSIS — L304 Erythema intertrigo: Secondary | ICD-10-CM | POA: Diagnosis not present

## 2022-03-22 DIAGNOSIS — R600 Localized edema: Secondary | ICD-10-CM

## 2022-03-26 DIAGNOSIS — I10 Essential (primary) hypertension: Secondary | ICD-10-CM | POA: Diagnosis not present

## 2022-03-26 DIAGNOSIS — R600 Localized edema: Secondary | ICD-10-CM | POA: Diagnosis not present

## 2022-03-26 DIAGNOSIS — I509 Heart failure, unspecified: Secondary | ICD-10-CM | POA: Diagnosis not present

## 2022-03-26 DIAGNOSIS — N1832 Chronic kidney disease, stage 3b: Secondary | ICD-10-CM | POA: Diagnosis not present

## 2022-03-27 ENCOUNTER — Ambulatory Visit (HOSPITAL_COMMUNITY)
Admission: RE | Admit: 2022-03-27 | Discharge: 2022-03-27 | Disposition: A | Discharge status: Home / Self Care | Payer: PPO | Source: Ambulatory Visit | Attending: Internal Medicine | Admitting: Internal Medicine

## 2022-03-27 ENCOUNTER — Ambulatory Visit (HOSPITAL_BASED_OUTPATIENT_CLINIC_OR_DEPARTMENT_OTHER)
Admission: RE | Admit: 2022-03-27 | Discharge: 2022-03-27 | Disposition: A | Discharge status: Home / Self Care | Payer: PPO | Source: Ambulatory Visit | Attending: Internal Medicine | Admitting: Internal Medicine

## 2022-03-27 DIAGNOSIS — R600 Localized edema: Secondary | ICD-10-CM | POA: Diagnosis not present

## 2022-03-27 DIAGNOSIS — I700 Atherosclerosis of aorta: Secondary | ICD-10-CM | POA: Diagnosis not present

## 2022-03-27 LAB — ECHOCARDIOGRAM COMPLETE
Area-P 1/2: 2.95 cm2
S' Lateral: 3.7 cm

## 2022-03-27 NOTE — Progress Notes (Signed)
*  PRELIMINARY RESULTS* Echocardiogram 2D Echocardiogram has been performed.  Allen Pierce 03/27/2022, 11:23 AM

## 2022-04-02 DIAGNOSIS — R413 Other amnesia: Secondary | ICD-10-CM | POA: Diagnosis not present

## 2022-04-02 DIAGNOSIS — I509 Heart failure, unspecified: Secondary | ICD-10-CM | POA: Diagnosis not present

## 2022-04-10 DIAGNOSIS — M25511 Pain in right shoulder: Secondary | ICD-10-CM | POA: Diagnosis not present

## 2022-04-19 DIAGNOSIS — C44219 Basal cell carcinoma of skin of left ear and external auricular canal: Secondary | ICD-10-CM | POA: Diagnosis not present

## 2022-04-19 DIAGNOSIS — X32XXXD Exposure to sunlight, subsequent encounter: Secondary | ICD-10-CM | POA: Diagnosis not present

## 2022-04-19 DIAGNOSIS — L570 Actinic keratosis: Secondary | ICD-10-CM | POA: Diagnosis not present

## 2022-04-24 DIAGNOSIS — I2510 Atherosclerotic heart disease of native coronary artery without angina pectoris: Secondary | ICD-10-CM | POA: Diagnosis not present

## 2022-04-24 DIAGNOSIS — I10 Essential (primary) hypertension: Secondary | ICD-10-CM | POA: Diagnosis not present

## 2022-05-07 ENCOUNTER — Ambulatory Visit: Payer: PPO | Attending: Internal Medicine | Admitting: Internal Medicine

## 2022-05-07 ENCOUNTER — Encounter: Payer: Self-pay | Admitting: Internal Medicine

## 2022-05-07 VITALS — BP 144/62 | HR 70 | Ht 73.0 in | Wt 215.6 lb

## 2022-05-07 DIAGNOSIS — G4733 Obstructive sleep apnea (adult) (pediatric): Secondary | ICD-10-CM | POA: Diagnosis not present

## 2022-05-07 DIAGNOSIS — I340 Nonrheumatic mitral (valve) insufficiency: Secondary | ICD-10-CM

## 2022-05-07 DIAGNOSIS — I10 Essential (primary) hypertension: Secondary | ICD-10-CM

## 2022-05-07 DIAGNOSIS — M7989 Other specified soft tissue disorders: Secondary | ICD-10-CM

## 2022-05-07 NOTE — Patient Instructions (Signed)
Medication Instructions:  Your physician recommends that you continue on your current medications as directed. Please refer to the Current Medication list given to you today.   Labwork: None  Testing/Procedures: [place] Sleep Study Venous Reflux Study  Follow-Up: Follow up with Dr. Dellia Cloud in 1 year.   Any Other Special Instructions Will Be Listed Below (If Applicable).     If you need a refill on your cardiac medications before your next appointment, please call your pharmacy.

## 2022-05-07 NOTE — Progress Notes (Signed)
Cardiology Office Note  Date: 05/07/2022   ID: Allen Pierce, DOB 06-Jun-1940, MRN [id number]  PCP:  Deland Pretty, MD  Cardiologist:  Chalmers Guest, MD Electrophysiologist:  None   Reason for Office Visit: Lower extremity swelling evaluation at the request of Dr. Shelia Media   History of Present Illness: Allen Pierce is a 81 y.o. male known to have HTN, HLD was referred to cardiology clinic for evaluation of lower extremity edema the request of Dr. Shelia Media.  Patient underwent echocardiogram in 03/2022 which showed normal LVEF and normal diastolic parameters with mild MR. CT cardiac scoring in 03/2022 showed a calcium score of 61.  Patient denied any DOE/angina when he walks for 1-1 and half miles at his own pace.  He reported dizziness/lightheadedness episodes few times per week after which his metoprolol dose was decreased from a 25 mg twice a day to 12.5 mg twice a day after which she noticed no more dizziness episodes. He sometimes also feels like the room is spinning around him. Denied any syncope, palpitations. He was diagnosed with sleep apnea more than 10 years ago and used CPAP for quite some time. However he was taken off of it as he was told he did not require it anymore. Denied alcohol use, smoking cigarettes, illicit drug abuse.  Past Medical History:  Diagnosis Date   A-fib [facility])    no cardiologist occ heard by dr pharr   AP (abdominal pain)    Back pain    bad disc   Dysrhythmia    occ followed by dr Shelia Media   GERD (gastroesophageal reflux disease)    Hemorrhoids    History of gout    History of hiatal hernia    History of kidney stones    HLD (hyperlipidemia)    HTN (hypertension)    Inguinal hernia    Melanoma ([place]) 2017, 2021   skin cancer ears x 2, early skin cancer removed from forehead dr hall Mar 24 2020   Rash 03/2020   buttock area using cream healing    Right inguinal hernia    Sleep apnea    discontinued cpap 2011 due to no longer needed per md     Past Surgical History:  Procedure Laterality Date   COLONOSCOPY     x 2 along with endoscopy each time    DIAGNOSTIC LAPAROSCOPY  2018   HERNIA REPAIR     Right   INGUINAL HERNIA REPAIR Right 08/17/2016   Procedure: LAPAROSCOPIC RIGHT INGUINAL HERNIA REPAIR;  Surgeon: Clovis Riley, MD;  Location: [facility];  Service: General;  Laterality: Right;   INGUINAL HERNIA REPAIR Right 01/25/2017   Procedure: RIGHT INGUINAL HERNIA REPAIR WITH MESH;  Surgeon: Clovis Riley, MD;  Location: [place];  Service: General;  Laterality: Right;   INGUINAL HERNIA REPAIR Left 04/13/2020   Procedure: OPEN LEFT INGUINAL HERNIA REPAIR WITH MESH;  Surgeon: Clovis Riley, MD;  Location: [facility];  Service: General;  Laterality: Left;   INSERTION OF MESH Right 01/25/2017   Procedure: INSERTION OF MESH;  Surgeon: Clovis Riley, MD;  Location: [facility];  Service: General;  Laterality: Right;   LAPAROSCOPIC REMOVAL OF MESENTERIC MASS N/A 08/17/2016   Procedure: BIOPSY OF MESENTERIC MASS;  Surgeon: Clovis Riley, MD;  Location: [facility];  Service: General;  Laterality: N/A;    Current Outpatient Medications  Medication Sig Dispense Refill   acetaminophen (TYLENOL) 500 MG tablet 1 tablet as needed  allopurinol (ZYLOPRIM) 300 MG tablet Take 1 tablet by mouth daily.     aspirin 81 MG chewable tablet 1 tablet     B-Complex TABS See admin instructions.     Cholecalciferol (VITAMIN D3) 50 MCG (2000 UT) capsule 1 capsule     clotrimazole (LOTRIMIN) 1 % cream 1 application     Cyanocobalamin (VITAMIN B 12 PO) Take by mouth. 1000 mcg daily     famotidine (PEPCID) 20 MG tablet 1 tablet at bedtime as needed     gabapentin (NEURONTIN) 300 MG capsule TAKE 1 CAPSULE BY MOUTH twicea day     metoprolol (LOPRESSOR) 50 MG tablet Take 25 mg by mouth 2 (two) times daily.      Multiple Vitamins-Minerals (AIRBORNE) CHEW See admin instructions.     OVER THE COUNTER MEDICATION Super c/vit d/ zinc daily      OVER THE COUNTER MEDICATION Vitamin d 3 25 mg daily     potassium chloride (KLOR-CON) 10 MEQ tablet Take 10 mEq by mouth every other day.     rosuvastatin (CRESTOR) 10 MG tablet Take 20 mg by mouth every other day. At qhs     tamsulosin (FLOMAX) 0.4 MG CAPS capsule 1 capsule 30 minutes after the same meal each day Orally Once a day AN HOUR AFTER SUPPER for 90 days     Turmeric (QC TUMERIC COMPLEX) 500 MG CAPS Take by mouth daily.     hydrALAZINE (APRESOLINE) 25 MG tablet 1 tablet with food (Patient not taking: Reported on 05/07/2022)     ibuprofen (ADVIL) 200 MG tablet Take 200 mg by mouth every 6 (six) hours as needed. Takes  400 mg prn (Patient not taking: Reported on 05/07/2022)     naproxen sodium (ANAPROX) 220 MG tablet Take 220 mg by mouth 2 (two) times daily as needed. aleve (Patient not taking: Reported on 05/07/2022)     traMADol (ULTRAM) 50 MG tablet 1 to 2 tablets (Patient not taking: Reported on 05/07/2022)     Zinc 50 MG TABS 1 tablet (Patient not taking: Reported on 05/07/2022)     No current facility-administered medications for this visit.   Allergies:  Lisinopril   Social History: The patient  reports that he has quit smoking. His smoking use included cigarettes. He has never used smokeless tobacco. He reports that he does not drink alcohol and does not use drugs.   Family History: The patient's family history includes CVA in his father; Cancer in his brother; Colon polyps in his brother; Diabetes in his brother; Heart attack in his father; Hypertension in his brother, father, and mother; Melanoma in his brother; Stroke in his brother.   ROS:  Please see the history of present illness. Otherwise, complete review of systems is positive for none.  All other systems are reviewed and negative.   Physical Exam: VS:  BP (!) 144/62   Pulse 70   Ht '6\' 1"'$  (1.854 m)   Wt 215 lb 9.6 oz (97.8 kg)   SpO2 97%   BMI 28.44 kg/m , BMI Body mass index is 28.44 kg/m.  Wt Readings from Last 3  Encounters:  05/07/22 215 lb 9.6 oz (97.8 kg)  11/14/21 212 lb (96.2 kg)  04/13/20 209 lb 9.6 oz (95.1 kg)    General: Patient appears comfortable at rest. HEENT: Conjunctiva and lids normal, oropharynx clear with moist mucosa. Neck: Supple, no elevated JVP or carotid bruits, no thyromegaly. Lungs: Clear to auscultation, nonlabored breathing at rest. Cardiac: Regular rate and  rhythm, no S3 or significant systolic murmur, no pericardial rub. Abdomen: Soft, nontender, no hepatomegaly, bowel sounds present, no guarding or rebound. Extremities: No pitting edema, distal pulses 2+. Skin: Warm and dry. Musculoskeletal: No kyphosis. Neuropsychiatric: Alert and oriented x3, affect grossly appropriate.  ECG: Normal sinus rhythm with first degree AV block  Recent Labwork: No results found for requested labs within last 365 days.  No results found for: "CHOL", "TRIG", "HDL", "CHOLHDL", "VLDL", "LDLCALC", "LDLDIRECT"  Other Studies Reviewed Today: CT calcium scoring on 03/27/2022 Calcium score is 61  Echo from 03/27/2022 LVEF 55 to 85% LV diastolic parameters were normal RV systolic function is normal LA size is moderately dilated Mild mitral valve regurgitation  Assessment and Plan: Patient is a 81 year old M known to have HTN, HLD was referred to cardiology clinic for evaluation of lower extremity edema the request of Dr. Shelia Media.  # Bilateral lower extremity swelling -Patient walks for 1-1.5 mile at his own pace with no symptoms of DOE or angina. Echo showed normal LVEF and normal diastology for his age. He also underwent CT calcium scoring which showed calcium score of 61.  I highly doubt that his LE swelling is cardiac in etiology.  However his LE swelling resolved with compression socks makes me wonder if he has chronic venous insufficiency. Obtain ultrasound venous reflux study bilateral lower extremities to confirm the diagnosis.  If the ultrasound confirms venous insufficiency of lower  extremities, he needs to continue using compression socks to prevent LE swelling.  If LE swelling is refractory to compression socks, he will need to be referred to vascular surgery. However patient does not want to undergo any invasive procedures at this point unless absolutely necessary.  # HTN, controlled -Continue metoprolol tartrate 12.5 mg twice daily (dose decreased recently due to dizziness) -Continue losartan 100 mg once daily  # HLD, unknown values -Continue rosuvastatin 20 mg every other day.  Goal LDL less than 100.  HLD management per PCP.  # Mild mitral valve regurgitation -Echo surveillance every 3 years  # History of OSA and current nocturia -Patient was diagnosed with OSA more than 10 years ago and was on CPAP for quite a while. However he was taken off of it as he was told it was not required anymore. -Patient not interested to get WatchPAT study. Sleep pulmonary referral for sleep study.  I have spent a total of 45 minutes with patient reviewing chart, EKGs, labs and examining patient as well as establishing an assessment and plan that was discussed with the patient.  > 50% of time was spent in direct patient care.    Medication Adjustments/Labs and Tests Ordered: Current medicines are reviewed at length with the patient today.  Concerns regarding medicines are outlined above.   Tests Ordered: Orders Placed This Encounter  Procedures   EKG 12-Lead   VAS [place] LOWER EXTREMITY VENOUS REFLUX    Medication Changes: No orders of the defined types were placed in this encounter.   Disposition:  Follow up  6 months  Signed, Jireh Vinas Fidel Levy, MD, 05/07/2022 12:28 PM    [facility] [place]. [address] [place], [place] [address]

## 2022-05-11 DIAGNOSIS — R3915 Urgency of urination: Secondary | ICD-10-CM | POA: Diagnosis not present

## 2022-05-11 DIAGNOSIS — R972 Elevated prostate specific antigen [PSA]: Secondary | ICD-10-CM | POA: Diagnosis not present

## 2022-05-11 DIAGNOSIS — R351 Nocturia: Secondary | ICD-10-CM | POA: Diagnosis not present

## 2022-05-11 DIAGNOSIS — N401 Enlarged prostate with lower urinary tract symptoms: Secondary | ICD-10-CM | POA: Diagnosis not present

## 2022-05-14 ENCOUNTER — Encounter: Payer: Self-pay | Admitting: Internal Medicine

## 2022-05-17 DIAGNOSIS — L570 Actinic keratosis: Secondary | ICD-10-CM | POA: Diagnosis not present

## 2022-05-17 DIAGNOSIS — H61002 Unspecified perichondritis of left external ear: Secondary | ICD-10-CM | POA: Diagnosis not present

## 2022-05-17 DIAGNOSIS — X32XXXD Exposure to sunlight, subsequent encounter: Secondary | ICD-10-CM | POA: Diagnosis not present

## 2022-05-17 DIAGNOSIS — Z85828 Personal history of other malignant neoplasm of skin: Secondary | ICD-10-CM | POA: Diagnosis not present

## 2022-05-17 DIAGNOSIS — Z08 Encounter for follow-up examination after completed treatment for malignant neoplasm: Secondary | ICD-10-CM | POA: Diagnosis not present

## 2022-05-22 ENCOUNTER — Ambulatory Visit (HOSPITAL_COMMUNITY)
Admission: RE | Admit: 2022-05-22 | Discharge: 2022-05-22 | Disposition: A | Discharge status: Home / Self Care | Payer: PPO | Source: Ambulatory Visit | Attending: Cardiology | Admitting: Cardiology

## 2022-05-22 DIAGNOSIS — M7989 Other specified soft tissue disorders: Secondary | ICD-10-CM | POA: Insufficient documentation

## 2022-06-03 ENCOUNTER — Emergency Department (HOSPITAL_COMMUNITY): Payer: PPO

## 2022-06-03 ENCOUNTER — Emergency Department (HOSPITAL_COMMUNITY)
Admission: EM | Admit: 2022-06-03 | Discharge: 2022-06-04 | Disposition: A | Discharge status: Home / Self Care | Payer: PPO | Attending: Emergency Medicine | Admitting: Emergency Medicine

## 2022-06-03 ENCOUNTER — Other Ambulatory Visit: Payer: Self-pay

## 2022-06-03 DIAGNOSIS — R42 Dizziness and giddiness: Secondary | ICD-10-CM

## 2022-06-03 DIAGNOSIS — M542 Cervicalgia: Secondary | ICD-10-CM | POA: Diagnosis not present

## 2022-06-03 DIAGNOSIS — S52502A Unspecified fracture of the lower end of left radius, initial encounter for closed fracture: Secondary | ICD-10-CM

## 2022-06-03 DIAGNOSIS — Z7982 Long term (current) use of aspirin: Secondary | ICD-10-CM | POA: Insufficient documentation

## 2022-06-03 DIAGNOSIS — R7989 Other specified abnormal findings of blood chemistry: Secondary | ICD-10-CM | POA: Diagnosis not present

## 2022-06-03 DIAGNOSIS — R600 Localized edema: Secondary | ICD-10-CM | POA: Diagnosis not present

## 2022-06-03 DIAGNOSIS — R519 Headache, unspecified: Secondary | ICD-10-CM | POA: Diagnosis not present

## 2022-06-03 DIAGNOSIS — M7989 Other specified soft tissue disorders: Secondary | ICD-10-CM | POA: Diagnosis not present

## 2022-06-03 DIAGNOSIS — S52612A Displaced fracture of left ulna styloid process, initial encounter for closed fracture: Secondary | ICD-10-CM | POA: Diagnosis not present

## 2022-06-03 DIAGNOSIS — W01198A Fall on same level from slipping, tripping and stumbling with subsequent striking against other object, initial encounter: Secondary | ICD-10-CM | POA: Insufficient documentation

## 2022-06-03 DIAGNOSIS — S6992XA Unspecified injury of left wrist, hand and finger(s), initial encounter: Secondary | ICD-10-CM | POA: Diagnosis present

## 2022-06-03 MED ORDER — BUPIVACAINE HCL (PF) 0.5 % IJ SOLN
20.0000 mL | Freq: Once | INTRAMUSCULAR | Status: AC
  Administered 2022-06-04: 20 mL
  Filled 2022-06-03: qty 20

## 2022-06-03 MED ORDER — LIDOCAINE HCL 2 % IJ SOLN: 20.0000 mL | Freq: Once | INTRAMUSCULAR | Status: DC

## 2022-06-03 NOTE — ED Provider Notes (Signed)
[facility] EMERGENCY DEPARTMENT Provider Note   CSN: [id number] Arrival date & time: 06/03/22  2046     History  Chief Complaint  Patient presents with   Fall / Wrist Injury    Allen Pierce is a 81 y.o. male.  Patient presents to the emergency department today for evaluation of left wrist injury occurring just prior to arrival.  Patient states that he was going to adjust his woodstove when he was on a slick floor.  His feet went out from underneath him and he reached back with an outstretched left arm and landed on his wrist.  He denies hitting his head or losing consciousness.  No associated lightheadedness or dizziness at the time of the fall.  He states that he was wearing socks on a slippery floor and this is what caused him to fall.  Patient then tells me that between 9 AM and noon today he had a significant dizzy spell.  He has never felt dizzy like this before.  He states that initially it felt like the room was spinning around and that someone "hit me in the head" however he denies acute headache.  He states that he had to hold onto a sink to stop himself from falling.  He does report some lightheadedness as well.  No full syncope.  He was able to go and sit in a recliner.  By about noon, the symptoms had resolved.  No associated chest pain, shortness of breath. Patient denies signs of stroke including: facial droop, slurred speech, aphasia, weakness/numbness in extremities, imbalance/trouble walking.        Home Medications Prior to Admission medications   Medication Sig Start Date End Date Taking? Authorizing Provider  acetaminophen (TYLENOL) 500 MG tablet 1 tablet as needed    [provider]  allopurinol (ZYLOPRIM) 300 MG tablet Take 1 tablet by mouth daily.    [provider]  aspirin 81 MG chewable tablet 1 tablet    [provider]  B-Complex TABS See admin instructions.    [provider]  Cholecalciferol  (VITAMIN D3) 50 MCG (2000 UT) capsule 1 capsule    [provider]  clotrimazole (LOTRIMIN) 1 % cream 1 application 82/42/35   [provider]  Cyanocobalamin (VITAMIN B 12 PO) Take by mouth. 1000 mcg daily    [provider]  famotidine (PEPCID) 20 MG tablet 1 tablet at bedtime as needed    [provider]  gabapentin (NEURONTIN) 300 MG capsule TAKE 1 CAPSULE BY MOUTH twicea day    [provider]  hydrALAZINE (APRESOLINE) 25 MG tablet 1 tablet with food Patient not taking: Reported on 05/07/2022 03/03/21   [provider]  ibuprofen (ADVIL) 200 MG tablet Take 200 mg by mouth every 6 (six) hours as needed. Takes  400 mg prn Patient not taking: Reported on 05/07/2022    [provider]  metoprolol (LOPRESSOR) 50 MG tablet Take 25 mg by mouth 2 (two) times daily.  05/08/16   [provider]  Multiple Vitamins-Minerals (AIRBORNE) CHEW See admin instructions.    [provider]  naproxen sodium (ANAPROX) 220 MG tablet Take 220 mg by mouth 2 (two) times daily as needed. aleve Patient not taking: Reported on 05/07/2022    [provider]  OVER THE COUNTER MEDICATION Super c/vit d/ zinc daily    [provider]  OVER THE COUNTER MEDICATION Vitamin d 3 25 mg daily    [provider]  potassium chloride (KLOR-CON) 10 MEQ tablet Take 10 mEq by mouth every other day. 03/02/21   [provider]  rosuvastatin (CRESTOR) 10 MG tablet Take 20 mg by mouth every other day. At qhs    [provider]  tamsulosin (FLOMAX) 0.4 MG CAPS capsule 1 capsule 30 minutes after the same meal each day Orally Once a day AN HOUR AFTER SUPPER for 90 days 03/01/22   [provider]  traMADol (ULTRAM) 50 MG tablet 1 to 2 tablets Patient not taking: Reported on 05/07/2022 07/15/20   [provider]  Turmeric (QC TUMERIC COMPLEX) 500 MG CAPS Take by mouth daily.    [provider]  Zinc 50  MG TABS 1 tablet Patient not taking: Reported on 05/07/2022    [provider]      Allergies    Lisinopril    Review of Systems   Review of Systems  Physical Exam Updated Vital Signs BP (!) 161/75   Pulse 65   Temp 98.4 F (36.9 C) (Oral)   Resp 16   SpO2 100%  Physical Exam Vitals and nursing note reviewed.  Constitutional:      General: He is not in acute distress.    Appearance: He is well-developed.  HENT:     Head: Normocephalic and atraumatic.     Right Ear: External ear normal.     Left Ear: External ear normal.     Nose: Nose normal.  Eyes:     General:        Right eye: No discharge.        Left eye: No discharge.     Conjunctiva/sclera: Conjunctivae normal.  Cardiovascular:     Rate and Rhythm: Normal rate and regular rhythm.     Heart sounds: Normal heart sounds.  Pulmonary:     Effort: Pulmonary effort is normal.     Breath sounds: Normal breath sounds.  Abdominal:     Palpations: Abdomen is soft.     Tenderness: There is no abdominal tenderness.  Musculoskeletal:     Left elbow: Normal range of motion. No tenderness.     Left forearm: No swelling or tenderness.     Left wrist: Deformity and tenderness present. Decreased range of motion.     Left hand: No tenderness. Normal range of motion.     Cervical back: Normal range of motion and neck supple. No tenderness. Normal range of motion.     Thoracic back: No tenderness.  Skin:    General: Skin is warm and dry.  Neurological:     Mental Status: He is alert.    ED Results / Procedures / Treatments   Labs (all labs ordered are listed, but only abnormal results are displayed) Labs Reviewed  CBC WITH DIFFERENTIAL/PLATELET - Abnormal; Notable for the following components:      Result Value   WBC 15.1 (*)    RBC 4.05 (*)    Hemoglobin 12.3 (*)    HCT 37.1 (*)    Neutro Abs 11.4 (*)    All other components within normal limits  BASIC METABOLIC PANEL - Abnormal; Notable for the following  components:   Glucose, Bld 114 (*)    Creatinine, Ser 1.90 (*)    GFR, Estimated 35 (*)    All other components within normal limits    EKG EKG Interpretation  Date/Time:  Monday June 04 2022 00:44:26 EST Ventricular Rate:  58 PR Interval:  239 QRS Duration: 101 QT Interval:  460 QTC Calculation: 452 R Axis:   -10 Text Interpretation: Sinus rhythm Prolonged PR interval Abnormal R-wave progression, early transition Nonspecific T abnrm, anterolateral leads No significant change since last tracing Confirmed by Orpah Greek [phone]) on 06/04/2022 12:51:44 AM  Radiology DG Hand Complete Left  Result Date: 06/03/2022 CLINICAL DATA:  Fall with wrist injury. EXAM: LEFT HAND - COMPLETE 3+ VIEW; LEFT FOREARM - 2 VIEW COMPARISON:  None Available. FINDINGS: Left hand, three views: There is osteopenia. There is a transverse comminuted fracture of the distal radial metaphysis. The distal fragment is impacted against the proximal fragment, dorsally translated up to 1.5 cm, and dorsally tilted up to 45 degrees. The distal radial articular surface is grossly intact. Comminution fragments are present between fracture margins. There is also an ulnar styloid transverse fracture with up to 3 mm distraction and slight lateral displacement. Interosseous alignment is normal. There is no further evidence of fractures. 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 Soft tissue swelling is noted at the wrist and distal forearm without evidence of soft tissue gas. Left forearm AP Lat: The distal radial and ulnar styloid fractures are redemonstrated. There are no further radial or ulnar fractures. Mild osteopenia. IMPRESSION: 1. Transverse comminuted fracture of the distal radial metaphysis with up to 1.5 cm dorsal displacement and 45 degrees dorsal angulation of the distal fragment. 2. Ulnar styloid fracture with up to  3 mm distraction and slight lateral displacement. 3. Osteopenia. 4. Mild degenerative changes. Electronically Signed   By: Telford Nab M.D.   On: 06/03/2022 22:31   DG Forearm Left  Result Date: 06/03/2022 CLINICAL DATA:  Fall with wrist injury. EXAM: LEFT HAND - COMPLETE 3+ VIEW; LEFT FOREARM - 2 VIEW COMPARISON:  None Available. FINDINGS: Left hand, three views: There is osteopenia. There is a transverse comminuted fracture of the distal radial metaphysis. The distal fragment is impacted against the proximal fragment, dorsally translated up to 1.5 cm, and dorsally tilted up to 45 degrees. The distal radial articular surface is grossly intact. Comminution fragments are present between fracture margins. There is also an ulnar styloid transverse fracture with up to 3 mm distraction and slight lateral displacement. Interosseous alignment is normal. There is no further evidence of fractures. 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 Soft tissue swelling is noted at the wrist and distal forearm without evidence of soft tissue gas. Left forearm AP Lat: The distal radial and ulnar styloid fractures are redemonstrated. There are no further radial or ulnar fractures. Mild osteopenia. IMPRESSION: 1. Transverse comminuted fracture of the distal radial metaphysis with up to 1.5 cm dorsal displacement and 45 degrees dorsal angulation of the distal fragment. 2. Ulnar styloid fracture with up to 3 mm distraction and slight lateral displacement. 3. Osteopenia. 4. Mild degenerative changes. Electronically Signed   By: Telford Nab M.D.   On: 06/03/2022 22:31   DG Wrist Complete Left  Result Date: 06/03/2022 CLINICAL DATA:  fall EXAM: LEFT WRIST - COMPLETE 3+ VIEW COMPARISON:  None Available. FINDINGS: Markedly dorsally displaced and angulated, impacted, comminuted, and likely extra-articular acute distal radial fracture.  Acute displaced distal ulnar styloid fracture. There is no evidence of severe arthropathy or other focal bone abnormality. Subcutaneus soft tissue edema. IMPRESSION: 1. Acute markedly dorsally displaced and angulated, impacted, comminuted, and likely extra-articular distal radial fracture. 2.  Acute displaced distal ulnar styloid fracture. Electronically Signed   By: Iven Finn M.D.   On: 06/03/2022 22:27    Procedures .Nerve Block  Date/Time: 06/04/2022 12:17 AM  Performed by: Carlisle Cater, PA-C Authorized by: Carlisle Cater, PA-C   Consent:    Consent obtained:  Verbal   Consent given by:  Patient   Risks discussed:  Infection, nerve damage, swelling, unsuccessful block, pain and bleeding   Alternatives discussed:  Alternative treatment and no treatment Universal protocol:    Patient identity confirmed:  Verbally with patient, provided demographic data and arm band Indications:    Indications:  Pain relief Location:    Body area:  Upper extremity   Laterality:  Left Pre-procedure details:    Skin preparation:  Chlorhexidine Skin anesthesia:    Skin anesthesia method:  Local infiltration   Local anesthetic:  Bupivacaine 0.5% w/o epi Procedure details:    Block needle gauge:  21 G (hematoma block for wrist fx)   Anesthetic injected:  Bupivacaine 0.5% w/o epi   Injection procedure:  Anatomic landmarks identified, incremental injection, negative aspiration for blood, anatomic landmarks palpated and introduced needle Post-procedure details:    Dressing:  None   Outcome:  Anesthesia achieved   Procedure completion:  Tolerated well, no immediate complications Reduction of fracture  Date/Time: 06/04/2022 1:03 AM  Performed by: Carlisle Cater, PA-C Authorized by: Carlisle Cater, PA-C  Consent: Verbal consent obtained. Consent given by: patient Patient identity confirmed: verbally with patient, arm band and provided demographic data Local anesthesia used: yes Anesthesia: hematoma  block  Anesthesia: Local anesthesia used: yes Local Anesthetic: bupivacaine 0.5% without epinephrine Anesthetic total: 5 mL  Sedation: Patient sedated: no  Patient tolerance: patient tolerated the procedure well with no immediate complications Comments: Patient placed in finger traps and 5 > 10 lbs applied for about 15 minutes. Manual reduction maneuver performed at bedside and then patient splinted. Dr. Betsey Holiday at bedside.       Medications Ordered in ED Medications  sodium chloride 0.9 % bolus 1,000 mL (has no administration in time range)  bupivacaine(PF) (MARCAINE) 0.5 % injection 20 mL (20 mLs Infiltration Given by Other 06/04/22 0030)  fentaNYL (SUBLIMAZE) injection 50 mcg (50 mcg Intravenous Given 06/04/22 0048)    ED Course/ Medical Decision Making/ A&P    Patient seen and examined. History obtained directly from patient.  Left upper extremity imaging ordered in triage.  Labs/EKG: I added CBC, BMP and EKG based on the patient's dizzy spell this morning, although this is unrelated to the fall occurring this evening.  Imaging: X-ray of the hand, wrist, forearm personally reviewed, patient with significant displaced left distal radius fracture and ulnar fracture.  This will require reduction.  CT head and cervical spine ordered in triage.  Patient does not seem to have a head or neck injury, however given the dizzy spell reported this morning, agree with additional imaging.  This is pending.  Medications/Fluids: Ordered: Lidocaine 2% without epinephrine  Most recent vital signs reviewed and are as follows: BP (!) 161/75   Pulse 65   Temp 98.4 F (36.9 C) (Oral)   Resp 16   SpO2 100%   Initial impression: Distal radius fracture.  Plan: I discussed hematoma block and reduction attempt with patient at bedside.  He agrees to proceed.  Will do this after he returns from CT.  I have discussed the case with Dr. Betsey Holiday who agrees with plan.  1:06 AM Reassessment performed.  Patient appears stable.  Tolerated procedures fairly well.  Labs personally reviewed and interpreted including: CBC with elevated white blood cell count 15.1, likely in part due to pain of injury, slightly low hemoglobin from baseline 12.3; BMP normal electrolytes, creatinine elevated from baseline 1.9.  EKG personally reviewed and interpreted as above.  No acute changes.  Imaging personally visualized and interpreted including: CT head and C-spine, agree no acute findings.  Reviewed pertinent lab work and imaging with patient at bedside. Questions answered.   Most current vital signs reviewed and are as follows: BP (!) 169/79   Pulse 66   Temp 98.4 F (36.9 C) (Oral)   Resp 11   SpO2 100%   Plan: Given elevated creatinine, episodic dizziness after being in the shower this morning, will give fluid bolus and obtain postreduction imaging.  Patient will need outpatient follow-up with orthopedics and will be given follow-up information.  1:59 AM Some improvement on post-reduction films. Awaiting IVF.   3:20 AM Reassessment performed. Patient appears stable.  IV fluids complete.  Family member now bedside.  Reviewed pertinent lab work and imaging with patient at bedside. Questions answered.  Discussed need to follow-up with PCP to have creatinine rechecked in about 1 week and for follow-up with orthopedics in regards to his wrist fracture.  Most current vital signs reviewed and are as follows: BP (!) 168/65 (BP Location: Right Arm)   Pulse (!) 58   Temp 97.7 F (36.5 C) (Oral)   Resp 16   SpO2 100%   Plan: Discharge to home.   Prescriptions written for: Oxycodone '5mg'$  #8 tabs  Patient counseled on use of narcotic pain medications. Counseled not to combine these medications with others containing tylenol. Urged not to drink alcohol, drive, or perform any other activities that requires focus while taking these medications. The patient verbalizes understanding and agrees with the  plan.  Use pain medication only under direct supervision at the lowest possible dose needed to control your pain.   Other home care instructions discussed: Ice, elevation  ED return instructions discussed: Worsening pain, swelling.  We also discussed need to return if he has recurrent dizzy spells especially if he develops associated chest pain, shortness of breath, any strokelike symptoms.  He verbalizes understanding and seems willing to do this.  Follow-up instructions discussed: Patient encouraged to follow-up with their PCP in 3 days, Ortho in the next 5 days.                          Medical Decision Making Amount and/or Complexity of Data Reviewed Labs: ordered. Radiology: ordered.  Risk Prescription drug management.   Fall: Distal radius and ulna fracture.  Mechanical nature.  Patient slipped.  No head or neck injury, head imaging negative.  Hematoma block and reduction performed in the emergency department tonight with improved alignment, however patient will need orthopedic follow-up.  Dizzy episode: This was not the patient's primary complaint.  This occurred earlier today and resolved spontaneously.  No strokelike symptoms.  No associated chest pain or shortness of breath.  Labs look okay, normal electrolytes.  Creatinine was a bit elevated and he was treated with IV fluids.  He will need to have this rechecked.  No concern for stroke, PE, ACS.  Low concern for arrhythmia.  The patient's vital signs, pertinent lab work and imaging were reviewed and interpreted as discussed in the ED course. Hospitalization was considered for further testing, treatments, or serial exams/observation. However as patient is  well-appearing, has a stable exam, and reassuring studies today, I do not feel that they warrant admission at this time. This plan was discussed with the patient who verbalizes agreement and comfort with this plan and seems reliable and able to return to the Emergency Department  with worsening or changing symptoms.          Final Clinical Impression(s) / ED Diagnoses Final diagnoses:  Closed fracture of distal end of left radius, unspecified fracture morphology, initial encounter  Closed displaced fracture of styloid process of left ulna, initial encounter  Dizziness  Elevated serum creatinine    Rx / DC Orders ED Discharge Orders          Ordered    oxyCODONE (OXY IR/ROXICODONE) 5 MG immediate release tablet  Every 6 hours PRN        06/04/22 0257              Carlisle Cater, PA-C 06/04/22 0323    Orpah Greek, MD 06/04/22 [phone]

## 2022-06-03 NOTE — ED Provider Notes (Incomplete)
[facility] EMERGENCY DEPARTMENT Provider Note   CSN: [id number] Arrival date & time: 06/03/22  2046     History {Add pertinent medical, surgical, social history, OB history to HPI:1} Chief Complaint  Patient presents with  . Fall / Wrist Injury    Allen Pierce is a 81 y.o. male.  Patient presents to the emergency department today for evaluation of left wrist injury occurring just prior to arrival.  Patient states that he was going to adjust his woodstove when he was on a slick floor.  His feet went out from underneath him and he reached back with an outstretched left arm and landed on his wrist.  He denies hitting his head or losing consciousness.  No associated lightheadedness or dizziness at the time of the fall.  He states that he was wearing socks on a slippery floor and this is what caused him to fall.  Patient then tells me that between 9 AM and noon today he had a significant dizzy spell.  He has never felt dizzy like this before.  He states that initially it felt like the room was spinning around and that someone "hit me in the head" however he denies acute headache.  He states that he had to hold onto a sink to stop himself from falling.  He does report some lightheadedness as well.  No full syncope.  He was able to go and sit in a recliner.  By about noon, the symptoms had resolved.  No associated chest pain, shortness of breath. Patient denies signs of stroke including: facial droop, slurred speech, aphasia, weakness/numbness in extremities, imbalance/trouble walking.         Home Medications Prior to Admission medications   Medication Sig Start Date End Date Taking? Authorizing Provider  acetaminophen (TYLENOL) 500 MG tablet 1 tablet as needed    [provider]  allopurinol (ZYLOPRIM) 300 MG tablet Take 1 tablet by mouth daily.    [provider]  aspirin 81 MG chewable tablet 1 tablet    [provider]  B-Complex TABS See  admin instructions.    [provider]  Cholecalciferol (VITAMIN D3) 50 MCG (2000 UT) capsule 1 capsule    [provider]  clotrimazole (LOTRIMIN) 1 % cream 1 application 93/79/02   [provider]  Cyanocobalamin (VITAMIN B 12 PO) Take by mouth. 1000 mcg daily    [provider]  famotidine (PEPCID) 20 MG tablet 1 tablet at bedtime as needed    [provider]  gabapentin (NEURONTIN) 300 MG capsule TAKE 1 CAPSULE BY MOUTH twicea day    [provider]  hydrALAZINE (APRESOLINE) 25 MG tablet 1 tablet with food Patient not taking: Reported on 05/07/2022 03/03/21   [provider]  ibuprofen (ADVIL) 200 MG tablet Take 200 mg by mouth every 6 (six) hours as needed. Takes  400 mg prn Patient not taking: Reported on 05/07/2022    [provider]  metoprolol (LOPRESSOR) 50 MG tablet Take 25 mg by mouth 2 (two) times daily.  05/08/16   [provider]  Multiple Vitamins-Minerals (AIRBORNE) CHEW See admin instructions.    [provider]  naproxen sodium (ANAPROX) 220 MG tablet Take 220 mg by mouth 2 (two) times daily as needed. aleve Patient not taking: Reported on 05/07/2022    [provider]  OVER THE COUNTER MEDICATION Super c/vit d/ zinc daily    [provider]  OVER THE COUNTER MEDICATION Vitamin d 3  25 mg daily    [provider]  potassium chloride (KLOR-CON) 10 MEQ tablet Take 10 mEq by mouth every other day. 03/02/21   [provider]  rosuvastatin (CRESTOR) 10 MG tablet Take 20 mg by mouth every other day. At qhs    [provider]  tamsulosin (FLOMAX) 0.4 MG CAPS capsule 1 capsule 30 minutes after the same meal each day Orally Once a day AN HOUR AFTER SUPPER for 90 days 03/01/22   [provider]  traMADol (ULTRAM) 50 MG tablet 1 to 2 tablets Patient not taking: Reported on 05/07/2022 07/15/20   [provider]  Turmeric (QC TUMERIC COMPLEX) 500 MG  CAPS Take by mouth daily.    [provider]  Zinc 50 MG TABS 1 tablet Patient not taking: Reported on 05/07/2022    [provider]      Allergies    Lisinopril    Review of Systems   Review of Systems  Physical Exam Updated Vital Signs BP (!) 161/75   Pulse 65   Temp 98.4 F (36.9 C) (Oral)   Resp 16   SpO2 100%  Physical Exam Vitals and nursing note reviewed.  Constitutional:      General: He is not in acute distress.    Appearance: He is well-developed.  HENT:     Head: Normocephalic and atraumatic.     Right Ear: External ear normal.     Left Ear: External ear normal.     Nose: Nose normal.  Eyes:     General:        Right eye: No discharge.        Left eye: No discharge.     Conjunctiva/sclera: Conjunctivae normal.  Cardiovascular:     Rate and Rhythm: Normal rate and regular rhythm.     Heart sounds: Normal heart sounds.  Pulmonary:     Effort: Pulmonary effort is normal.     Breath sounds: Normal breath sounds.  Abdominal:     Palpations: Abdomen is soft.     Tenderness: There is no abdominal tenderness.  Musculoskeletal:     Left elbow: Normal range of motion. No tenderness.     Left forearm: No swelling or tenderness.     Left wrist: Deformity and tenderness present. Decreased range of motion.     Left hand: No tenderness. Normal range of motion.     Cervical back: Normal range of motion and neck supple. No tenderness. Normal range of motion.     Thoracic back: No tenderness.  Skin:    General: Skin is warm and dry.  Neurological:     Mental Status: He is alert.     ED Results / Procedures / Treatments   Labs (all labs ordered are listed, but only abnormal results are displayed) Labs Reviewed  CBC WITH DIFFERENTIAL/PLATELET  BASIC METABOLIC PANEL    EKG None  Radiology DG Hand Complete Left  Result Date: 06/03/2022 CLINICAL DATA:  Fall with wrist injury. EXAM: LEFT HAND - COMPLETE 3+ VIEW; LEFT FOREARM - 2 VIEW  COMPARISON:  None Available. FINDINGS: Left hand, three views: There is osteopenia. There is a transverse comminuted fracture of the distal radial metaphysis. The distal fragment is impacted against the proximal fragment, dorsally translated up to 1.5 cm, and dorsally tilted up to 45 degrees. The distal radial articular surface is grossly intact. Comminution fragments are present between fracture margins. There is also an ulnar styloid transverse fracture with up to 3 mm  distraction and slight lateral displacement. Interosseous alignment is normal. There is no further evidence of fractures. 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 Soft tissue swelling is noted at the wrist and distal forearm without evidence of soft tissue gas. Left forearm AP Lat: The distal radial and ulnar styloid fractures are redemonstrated. There are no further radial or ulnar fractures. Mild osteopenia. IMPRESSION: 1. Transverse comminuted fracture of the distal radial metaphysis with up to 1.5 cm dorsal displacement and 45 degrees dorsal angulation of the distal fragment. 2. Ulnar styloid fracture with up to 3 mm distraction and slight lateral displacement. 3. Osteopenia. 4. Mild degenerative changes. Electronically Signed   By: Telford Nab M.D.   On: 06/03/2022 22:31   DG Forearm Left  Result Date: 06/03/2022 CLINICAL DATA:  Fall with wrist injury. EXAM: LEFT HAND - COMPLETE 3+ VIEW; LEFT FOREARM - 2 VIEW COMPARISON:  None Available. FINDINGS: Left hand, three views: There is osteopenia. There is a transverse comminuted fracture of the distal radial metaphysis. The distal fragment is impacted against the proximal fragment, dorsally translated up to 1.5 cm, and dorsally tilted up to 45 degrees. The distal radial articular surface is grossly intact. Comminution fragments are present between fracture margins. There is also an ulnar  styloid transverse fracture with up to 3 mm distraction and slight lateral displacement. Interosseous alignment is normal. There is no further evidence of fractures. 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 Soft tissue swelling is noted at the wrist and distal forearm without evidence of soft tissue gas. Left forearm AP Lat: The distal radial and ulnar styloid fractures are redemonstrated. There are no further radial or ulnar fractures. Mild osteopenia. IMPRESSION: 1. Transverse comminuted fracture of the distal radial metaphysis with up to 1.5 cm dorsal displacement and 45 degrees dorsal angulation of the distal fragment. 2. Ulnar styloid fracture with up to 3 mm distraction and slight lateral displacement. 3. Osteopenia. 4. Mild degenerative changes. Electronically Signed   By: Telford Nab M.D.   On: 06/03/2022 22:31   DG Wrist Complete Left  Result Date: 06/03/2022 CLINICAL DATA:  fall EXAM: LEFT WRIST - COMPLETE 3+ VIEW COMPARISON:  None Available. FINDINGS: Markedly dorsally displaced and angulated, impacted, comminuted, and likely extra-articular acute distal radial fracture. Acute displaced distal ulnar styloid fracture. There is no evidence of severe arthropathy or other focal bone abnormality. Subcutaneus soft tissue edema. IMPRESSION: 1. Acute markedly dorsally displaced and angulated, impacted, comminuted, and likely extra-articular distal radial fracture. 2. Acute displaced distal ulnar styloid fracture. Electronically Signed   By: Iven Finn M.D.   On: 06/03/2022 22:27    Procedures Procedures  {Document cardiac monitor, telemetry assessment procedure when appropriate:1}  Medications Ordered in ED Medications  lidocaine (XYLOCAINE) 2 % (with pres) injection 400 mg (has no administration in time range)    ED Course/ Medical Decision Making/ A&P    Patient seen and examined.  History obtained directly from patient.  Left upper extremity imaging ordered in triage.  Labs/EKG: I added CBC, BMP and EKG based on the patient's dizzy spell this morning, although this is unrelated to the fall occurring this evening.  Imaging: X-ray of the hand, wrist, forearm personally reviewed, patient with significant displaced left distal radius fracture and ulnar fracture.  This will require reduction.  CT head and cervical spine ordered  in triage.  Patient does not seem to have a head or neck injury, however given the dizzy spell reported this morning, agree with additional imaging.  This is pending.  Medications/Fluids: Ordered: Lidocaine 2% without epinephrine  Most recent vital signs reviewed and are as follows: BP (!) 161/75   Pulse 65   Temp 98.4 F (36.9 C) (Oral)   Resp 16   SpO2 100%   Initial impression: Distal radius fracture.  Plan: I discussed hematoma block and reduction attempt with patient at bedside.  He agrees to proceed.  Will do this after he returns from CT.  I have discussed the case with Dr. Betsey Holiday who agrees with plan.                           Medical Decision Making Amount and/or Complexity of Data Reviewed Labs: ordered.  Risk Prescription drug management.   ***  {Document critical care time when appropriate:1} {Document review of labs and clinical decision tools ie heart score, Chads2Vasc2 etc:1}  {Document your independent review of radiology images, and any outside records:1} {Document your discussion with family members, caretakers, and with consultants:1} {Document social determinants of health affecting pt's care:1} {Document your decision making why or why not admission, treatments were needed:1} Final Clinical Impression(s) / ED Diagnoses Final diagnoses:  None    Rx / DC Orders ED Discharge Orders     None

## 2022-06-03 NOTE — ED Triage Notes (Signed)
Patient lost his balance and fell backwards , denies LOC , presents with left wrist swelling.

## 2022-06-03 NOTE — ED Provider Triage Note (Signed)
Emergency Medicine Provider Triage Evaluation Note  Allen Pierce , a 81 y.o. male  was evaluated in triage.  Pt complains of fall on outstretched hand around an hour prior to arrival.  Patient reports that he thinks he may have hit his head a little but is not on any blood thinners.  No LOC.  He reports that he was putting wood in his wood stove when the floor was slick and he slipped backwards.  Review of Systems  Positive: Negative:   Physical Exam  BP (!) 161/75   Pulse 65   Temp 98.4 F (36.9 C) (Oral)   Resp 16   SpO2 100%  Gen:   Awake, no distress   Resp:  Normal effort  MSK:   Moves extremities without difficulty  Other:  Obvious deformity.  Palpable pulses.  Compartments are firm, but pliable. No open fracture.   Medical Decision Making  Medically screening exam initiated at 9:24 PM.  Appropriate orders placed.  Allen Pierce was informed that the remainder of the evaluation will be completed by another provider, this initial triage assessment does not replace that evaluation, and the importance of remaining in the ED until their evaluation is complete.  XR ordered. Charge nurse aware that patient needs a room NOW with an obvious deformity.    Sherrell Puller, PA-C 06/03/22 2126

## 2022-06-04 ENCOUNTER — Emergency Department (HOSPITAL_COMMUNITY): Payer: PPO

## 2022-06-04 DIAGNOSIS — S52502A Unspecified fracture of the lower end of left radius, initial encounter for closed fracture: Secondary | ICD-10-CM | POA: Diagnosis not present

## 2022-06-04 DIAGNOSIS — R519 Headache, unspecified: Secondary | ICD-10-CM | POA: Diagnosis not present

## 2022-06-04 DIAGNOSIS — S52612A Displaced fracture of left ulna styloid process, initial encounter for closed fracture: Secondary | ICD-10-CM | POA: Diagnosis not present

## 2022-06-04 DIAGNOSIS — M542 Cervicalgia: Secondary | ICD-10-CM | POA: Diagnosis not present

## 2022-06-04 LAB — BASIC METABOLIC PANEL
Anion gap: 10 (ref 5–15)
BUN: 20 mg/dL (ref 8–23)
CO2: 25 mmol/L (ref 22–32)
Calcium: 9 mg/dL (ref 8.9–10.3)
Chloride: 106 mmol/L (ref 98–111)
Creatinine, Ser: 1.9 mg/dL — ABNORMAL HIGH (ref 0.61–1.24)
GFR, Estimated: 35 mL/min — ABNORMAL LOW (ref >60)
Glucose, Bld: 114 mg/dL — ABNORMAL HIGH (ref 70–99)
Potassium: 3.9 mmol/L (ref 3.5–5.1)
Sodium: 141 mmol/L (ref 135–145)

## 2022-06-04 LAB — CBC WITH DIFFERENTIAL/PLATELET
Abs Immature Granulocytes: 0.06 10*3/uL (ref 0.00–0.07)
Basophils Absolute: 0.1 10*3/uL (ref 0.0–0.1)
Basophils Relative: 1 %
Eosinophils Absolute: 0.1 10*3/uL (ref 0.0–0.5)
Eosinophils Relative: 1 %
HCT: 37.1 % — ABNORMAL LOW (ref 39.0–52.0)
Hemoglobin: 12.3 g/dL — ABNORMAL LOW (ref 13.0–17.0)
Immature Granulocytes: 0 %
Lymphocytes Relative: 17 %
Lymphs Abs: 2.5 10*3/uL (ref 0.7–4.0)
MCH: 30.4 pg (ref 26.0–34.0)
MCHC: 33.2 g/dL (ref 30.0–36.0)
MCV: 91.6 fL (ref 80.0–100.0)
Monocytes Absolute: 0.9 10*3/uL (ref 0.1–1.0)
Monocytes Relative: 6 %
Neutro Abs: 11.4 10*3/uL — ABNORMAL HIGH (ref 1.7–7.7)
Neutrophils Relative %: 75 %
Platelets: 223 10*3/uL (ref 150–400)
RBC: 4.05 MIL/uL — ABNORMAL LOW (ref 4.22–5.81)
RDW: 13.7 % (ref 11.5–15.5)
WBC: 15.1 10*3/uL — ABNORMAL HIGH (ref 4.0–10.5)
nRBC: 0 % (ref 0.0–0.2)

## 2022-06-04 MED ORDER — FENTANYL CITRATE PF 50 MCG/ML IJ SOSY
50.0000 ug | PREFILLED_SYRINGE | Freq: Once | INTRAMUSCULAR | Status: AC
  Administered 2022-06-04: 50 ug via INTRAVENOUS

## 2022-06-04 MED ORDER — SODIUM CHLORIDE 0.9 % IV BOLUS
1000.0000 mL | Freq: Once | INTRAVENOUS | Status: AC
  Administered 2022-06-04: 1000 mL via INTRAVENOUS

## 2022-06-04 MED ORDER — FENTANYL CITRATE PF 50 MCG/ML IJ SOSY
PREFILLED_SYRINGE | INTRAMUSCULAR | Status: AC
  Filled 2022-06-04: qty 1

## 2022-06-04 MED ORDER — OXYCODONE HCL 5 MG PO TABS: 2.5000 mg | ORAL_TABLET | Freq: Four times a day (QID) | ORAL | 0 refills | Status: DC | PRN

## 2022-06-04 MED ORDER — OXYCODONE HCL 5 MG PO TABS
5.0000 mg | ORAL_TABLET | Freq: Once | ORAL | Status: AC
  Administered 2022-06-04: 5 mg via ORAL
  Filled 2022-06-04: qty 1

## 2022-06-04 NOTE — Progress Notes (Signed)
Orthopedic Tech Progress Note Patient Details:  Allen Pierce 1941/04/18 [id number]  Patient ID: Allen Pierce, male   DOB: 1940/08/08, 82 y.o.   MRN: [id number] I was called to check splint as the patient was complaining of it being to tight. I checked it. It was fine.  Karolee Stamps 06/04/2022, 4:05 AM

## 2022-06-04 NOTE — Discharge Instructions (Addendum)
Please read and follow all provided instructions.  Your diagnoses today include:  1. Closed fracture of distal end of left radius, unspecified fracture morphology, initial encounter   2. Closed displaced fracture of styloid process of left ulna, initial encounter     Tests performed today include: An x-ray of the affected area -shows fractures of the radius and ulna, alignment was improved but not perfect on repeat imaging CT of the head and neck: No injuries or other problems related to the dizzy spell earlier today Complete blood cell count:  Basic metabolic panel: Your kidney function was a bit weaker than it typically has been in the past, please have your levels rechecked by your doctor in 1 week Vital signs. See below for your results today.   Medications prescribed:  Oxycodone - narcotic pain medication  DO NOT drive or perform any activities that require you to be awake and alert because this medicine can make you drowsy.   Use pain medication only under direct supervision at the lowest possible dose needed to control your pain.   Take any prescribed medications only as directed.  Home care instructions:  Follow any educational materials contained in this packet Follow R.I.C.E. Protocol: R - rest your injury  I  - use ice on injury without applying directly to skin C - compress injury with bandage or splint E - elevate the injury as much as possible  Follow-up instructions: Please call the orthopedic doctor tomorrow to schedule an outpatient follow-up appointment.  Return instructions:  Please return if your fingers are numb or tingling, appear gray or blue, or you have severe pain (also elevate the arm and loosen splint or wrap if you were given one) Please return to the Emergency Department if you experience worsening symptoms.  Return if you have weakness in your arms or legs, slurred speech, trouble walking or talking, confusion, or trouble with your balance.  Please  return if you have any other emergent concerns.  Additional Information:  Your vital signs today were: BP (!) 176/69   Pulse (!) 57   Temp 98.4 F (36.9 C) (Oral)   Resp 10   SpO2 99%  If your blood pressure (BP) was elevated above 135/85 this visit, please have this repeated by your doctor within one month. --------------

## 2022-06-04 NOTE — Progress Notes (Signed)
Orthopedic Tech Progress Note Patient Details:  Allen Pierce 08-03-1940 [id number]  Ortho Devices Type of Ortho Device: Finger trap, Sugartong splint Finger Trap Weight: 10 Ortho Device/Splint Location: lue Ortho Device/Splint Interventions: Ordered, Application, Adjustment  I applied 10 lbs fingertrap at drs request. I applied a plaster sugartong post reduction. Post Interventions Patient Tolerated: Well Instructions Provided: Care of device, Adjustment of device  Karolee Stamps 06/04/2022, 1:51 AM

## 2022-06-04 NOTE — Progress Notes (Signed)
Orthopedic Tech Progress Note Patient Details:  AGOSTINO GORIN September 10, 1940 [id number]  Ortho Devices Type of Ortho Device: Arm sling Finger Trap Weight: 10 Ortho Device/Splint Location: lue Ortho Device/Splint Interventions: Ordered, Application, Adjustment   Post Interventions Patient Tolerated: Well Instructions Provided: Care of device, Adjustment of device  Karolee Stamps 06/04/2022, 2:09 AM

## 2022-06-07 ENCOUNTER — Other Ambulatory Visit: Payer: Self-pay

## 2022-06-07 ENCOUNTER — Encounter (HOSPITAL_BASED_OUTPATIENT_CLINIC_OR_DEPARTMENT_OTHER): Payer: Self-pay | Admitting: Orthopedic Surgery

## 2022-06-07 DIAGNOSIS — S52502S Unspecified fracture of the lower end of left radius, sequela: Secondary | ICD-10-CM | POA: Diagnosis not present

## 2022-06-08 DIAGNOSIS — N1832 Chronic kidney disease, stage 3b: Secondary | ICD-10-CM | POA: Diagnosis not present

## 2022-06-08 DIAGNOSIS — R42 Dizziness and giddiness: Secondary | ICD-10-CM | POA: Diagnosis not present

## 2022-06-08 DIAGNOSIS — D72819 Decreased white blood cell count, unspecified: Secondary | ICD-10-CM | POA: Diagnosis not present

## 2022-06-10 NOTE — H&P (Signed)
Preoperative History & Physical Exam  Surgeon: Matt Holmes, MD  Diagnosis: Left wrist fracture  Planned Procedure: Procedure(s) (LRB): OPEN REDUCTION INTERNAL FIXATION (ORIF) WRIST FRACTURE (Left)  History of Present Illness:   Patient is a 82 y.o. male with symptoms consistent with Left wrist fracture who presents for surgical intervention. The risks, benefits and alternatives of surgical intervention were discussed and informed consent was obtained prior to surgery.  Past Medical History:  Past Medical History:  Diagnosis Date   A-fib [facility])    no cardiologist occ heard by dr pharr   AP (abdominal pain)    Back pain    bad disc   Dysrhythmia    occ followed by dr Shelia Media   GERD (gastroesophageal reflux disease)    Hemorrhoids    History of gout    History of hiatal hernia    History of kidney stones    HLD (hyperlipidemia)    HTN (hypertension)    Inguinal hernia    Melanoma ([place]) 2017, 2021   skin cancer ears x 2, early skin cancer removed from forehead dr hall Mar 24 2020   Rash 03/2020   buttock area using cream healing    Right inguinal hernia    Sleep apnea    discontinued cpap 2011 due to no longer needed per md    Past Surgical History:  Past Surgical History:  Procedure Laterality Date   COLONOSCOPY     x 2 along with endoscopy each time    DIAGNOSTIC LAPAROSCOPY  2018   HERNIA REPAIR     Right   INGUINAL HERNIA REPAIR Right 08/17/2016   Procedure: LAPAROSCOPIC RIGHT INGUINAL HERNIA REPAIR;  Surgeon: Clovis Riley, MD;  Location: [facility];  Service: General;  Laterality: Right;   INGUINAL HERNIA REPAIR Right 01/25/2017   Procedure: RIGHT INGUINAL HERNIA REPAIR WITH MESH;  Surgeon: Clovis Riley, MD;  Location: [facility];  Service: General;  Laterality: Right;   INGUINAL HERNIA REPAIR Left 04/13/2020   Procedure: OPEN LEFT INGUINAL HERNIA REPAIR WITH MESH;  Surgeon: Clovis Riley, MD;  Location: [facility];  Service: General;   Laterality: Left;   INSERTION OF MESH Right 01/25/2017   Procedure: INSERTION OF MESH;  Surgeon: Clovis Riley, MD;  Location: [facility];  Service: General;  Laterality: Right;   LAPAROSCOPIC REMOVAL OF MESENTERIC MASS N/A 08/17/2016   Procedure: BIOPSY OF MESENTERIC MASS;  Surgeon: Clovis Riley, MD;  Location: [facility];  Service: General;  Laterality: N/A;    Medications:  Prior to Admission medications   Medication Sig Start Date End Date Taking? Authorizing Provider  acetaminophen (TYLENOL) 500 MG tablet 1 tablet as needed   Yes [provider]  allopurinol (ZYLOPRIM) 300 MG tablet Take 1 tablet by mouth daily.   Yes [provider]  aspirin 81 MG chewable tablet 1 tablet   Yes [provider]  B-Complex TABS See admin instructions.   Yes [provider]  Cholecalciferol (VITAMIN D3) 50 MCG (2000 UT) capsule 1 capsule   Yes [provider]  Cyanocobalamin (VITAMIN B 12 PO) Take by mouth. 1000 mcg daily   Yes [provider]  famotidine (PEPCID) 20 MG tablet 1 tablet at bedtime as needed   Yes [provider]  furosemide (LASIX) 20 MG tablet Take 20 mg by mouth 2 (two) times daily.   Yes [provider]  gabapentin (NEURONTIN) 300 MG capsule TAKE 1 CAPSULE BY MOUTH twicea day  Yes [provider]  metoprolol (LOPRESSOR) 50 MG tablet Take 25 mg by mouth 2 (two) times daily.  05/08/16  Yes [provider]  Multiple Vitamins-Minerals (AIRBORNE) CHEW See admin instructions.   Yes [provider]  potassium chloride (KLOR-CON) 10 MEQ tablet Take 10 mEq by mouth every other day. 03/02/21  Yes [provider]  rosuvastatin (CRESTOR) 10 MG tablet Take 20 mg by mouth every other day. At qhs   Yes [provider]  tamsulosin (FLOMAX) 0.4 MG CAPS capsule 1 capsule 30 minutes after the same meal each day Orally Once a day AN HOUR AFTER SUPPER for 90 days 03/01/22  Yes [provider]  Turmeric (QC TUMERIC COMPLEX) 500 MG CAPS Take by mouth daily.   Yes [provider]  clotrimazole (LOTRIMIN) 1 % cream 1 application 56/25/63   [provider]  hydrALAZINE (APRESOLINE) 25 MG tablet 1 tablet with food Patient not taking: Reported on 05/07/2022 03/03/21   [provider]  ibuprofen (ADVIL) 200 MG tablet Take 200 mg by mouth every 6 (six) hours as needed. Takes  400 mg prn Patient not taking: Reported on 05/07/2022    [provider]  naproxen sodium (ANAPROX) 220 MG tablet Take 220 mg by mouth 2 (two) times daily as needed. aleve Patient not taking: Reported on 05/07/2022    [provider]  OVER THE COUNTER MEDICATION Super c/vit d/ zinc daily    [provider]  OVER THE COUNTER MEDICATION Vitamin d 3 25 mg daily    [provider]  oxyCODONE (OXY IR/ROXICODONE) 5 MG immediate release tablet Take 0.5-1 tablets (2.5-5 mg total) by mouth every 6 (six) hours as needed for severe pain. 06/04/22   Carlisle Cater, PA-C  traMADol (ULTRAM) 50 MG tablet 1 to 2 tablets Patient not taking: Reported on 05/07/2022 07/15/20   [provider]  Zinc 50 MG TABS 1 tablet Patient not taking: Reported on 05/07/2022    [provider]    Allergies:  Lisinopril  Review of Systems: Negative except per HPI.  Physical Exam: Alert and oriented, NAD Head and neck: no masses, normal alignment CV: pulse intact Pulm: no increased work of breathing, respirations even and unlabored Abdomen: non-distended Extremities: extremities warm and well perfused  LABS: Recent Results (from the past 2160 hour(s))  ECHOCARDIOGRAM COMPLETE     Status: None   Collection Time: 03/27/22 11:23 AM  Result Value Ref Range   Area-P 1/2 2.95 cm2   S' Lateral 3.70 cm  CBC with Differential     Status: Abnormal   Collection Time: 06/03/22 11:50 PM  Result Value Ref Range   WBC 15.1 (H) 4.0 - 10.5 K/uL   RBC 4.05 (L) 4.22 - 5.81 MIL/uL    Hemoglobin 12.3 (L) 13.0 - 17.0 g/dL   HCT 37.1 (L) 39.0 - 52.0 %   MCV 91.6 80.0 - 100.0 fL   MCH 30.4 26.0 - 34.0 pg   MCHC 33.2 30.0 - 36.0 g/dL   RDW 13.7 11.5 - 15.5 %   Platelets 223 150 - 400 K/uL   nRBC 0.0 0.0 - 0.2 %   Neutrophils Relative % 75 %   Neutro Abs 11.4 (H) 1.7 - 7.7 K/uL   Lymphocytes Relative 17 %   Lymphs Abs 2.5 0.7 - 4.0 K/uL   Monocytes Relative 6 %   Monocytes Absolute 0.9 0.1 - 1.0 K/uL   Eosinophils Relative 1 %   Eosinophils Absolute 0.1 0.0 -  0.5 K/uL   Basophils Relative 1 %   Basophils Absolute 0.1 0.0 - 0.1 K/uL   Immature Granulocytes 0 %   Abs Immature Granulocytes 0.06 0.00 - 0.07 K/uL    Comment: Performed at [facility], [place] [address] [place], [place] [address]  Basic metabolic panel     Status: Abnormal   Collection Time: 06/03/22 11:50 PM  Result Value Ref Range   Sodium 141 135 - 145 mmol/L   Potassium 3.9 3.5 - 5.1 mmol/L   Chloride 106 98 - 111 mmol/L   CO2 25 22 - 32 mmol/L   Glucose, Bld 114 (H) 70 - 99 mg/dL    Comment: Glucose reference range applies only to samples taken after fasting for at least 8 hours.   BUN 20 8 - 23 mg/dL   Creatinine, Ser 1.90 (H) 0.61 - 1.24 mg/dL   Calcium 9.0 8.9 - 10.3 mg/dL   GFR, Estimated 35 (L) >60 mL/min    Comment: (NOTE) Calculated using the CKD-EPI Creatinine Equation (2021)    Anion gap 10 5 - 15    Comment: Performed at [facility] [address], [place], [place] [address]     Complete History and Physical exam available in the office notes  Orene Desanctis

## 2022-06-11 NOTE — Anesthesia Preprocedure Evaluation (Addendum)
Anesthesia Evaluation  Patient identified by MRN, date of birth, ID band Patient awake    Reviewed: Allergy & Precautions, NPO status , Patient's Chart, lab work & pertinent test results  History of Anesthesia Complications Negative for: history of anesthetic complications  Airway Mallampati: III  TM Distance: >3 FB Neck ROM: Full    Dental  (+) Dental Advisory Given   Pulmonary neg shortness of breath, sleep apnea , neg COPD, neg recent URI, former smoker   Pulmonary exam normal breath sounds clear to auscultation       Cardiovascular hypertension, (-) angina (-) Past MI, (-) Cardiac Stents and (-) CABG + dysrhythmias Atrial Fibrillation + Valvular Problems/Murmurs (mild) MR  Rhythm:Regular Rate:Normal  HLD  TTE 03/27/2022: IMPRESSIONS     1. Left ventricular ejection fraction, by estimation, is 55 to 60%. The  left ventricle has normal function. The left ventricle has no regional  wall motion abnormalities. Left ventricular diastolic parameters were  normal.   2. Right ventricular systolic function is normal. The right ventricular  size is normal. There is normal pulmonary artery systolic pressure.   3. Left atrial size was moderately dilated.   4. The mitral valve is abnormal. Mild mitral valve regurgitation. No  evidence of mitral stenosis.   5. The tricuspid valve is abnormal.   6. The aortic valve is tricuspid. There is mild calcification of the  aortic valve. There is mild thickening of the aortic valve. Aortic valve  regurgitation is not visualized. No aortic stenosis is present.   7. The inferior vena cava is normal in size with greater than 50%  respiratory variability, suggesting right atrial pressure of 3 mmHg.     Neuro/Psych neg Seizures  Neuromuscular disease (sciatica, lumbar spinal stenosis)    GI/Hepatic Neg liver ROS, hiatal hernia,GERD  ,,  Endo/Other  Pre-diabetes  Renal/GU CRFRenal disease    BPH    Musculoskeletal  (+) Arthritis , Osteoarthritis,    Abdominal  (+) - obese  Peds  Hematology negative hematology ROS (+)   Anesthesia Other Findings gout  Reproductive/Obstetrics                             Anesthesia Physical Anesthesia Plan  ASA: 2  Anesthesia Plan: MAC and Regional   Post-op Pain Management: Regional block* and Tylenol PO (pre-op)*   Induction: Intravenous  PONV Risk Score and Plan: 1 and Ondansetron, Dexamethasone, Propofol infusion and Treatment may vary due to age or medical condition  Airway Management Planned: Natural Airway and Simple Face Mask  Additional Equipment:   Intra-op Plan:   Post-operative Plan:   Informed Consent: I have reviewed the patients History and Physical, chart, labs and discussed the procedure including the risks, benefits and alternatives for the proposed anesthesia with the patient or authorized representative who has indicated his/her understanding and acceptance.     Dental advisory given  Plan Discussed with: CRNA and Anesthesiologist  Anesthesia Plan Comments: (NPO time at 3:00pm.  Discussed potential risks of nerve blocks including, but not limited to, infection, bleeding, nerve damage, seizures, pneumothorax, respiratory depression, and potential failure of the block. Alternatives to nerve blocks discussed. All questions answered.  Discussed with patient risks of MAC including, but not limited to, minor pain or discomfort, hearing people in the room, and possible need for backup general anesthesia. Risks for general anesthesia also discussed including, but not limited to, sore throat, hoarse voice, chipped/damaged teeth, injury to  vocal cords, nausea and vomiting, allergic reactions, lung infection, heart attack, stroke, and death. All questions answered. )        Anesthesia Quick Evaluation

## 2022-06-12 ENCOUNTER — Ambulatory Visit (HOSPITAL_BASED_OUTPATIENT_CLINIC_OR_DEPARTMENT_OTHER): Payer: PPO | Admitting: Anesthesiology

## 2022-06-12 ENCOUNTER — Ambulatory Visit (HOSPITAL_BASED_OUTPATIENT_CLINIC_OR_DEPARTMENT_OTHER)
Admission: RE | Admit: 2022-06-12 | Discharge: 2022-06-12 | Disposition: A | Discharge status: Home / Self Care | Payer: PPO | Attending: Orthopedic Surgery | Admitting: Orthopedic Surgery

## 2022-06-12 ENCOUNTER — Encounter (HOSPITAL_BASED_OUTPATIENT_CLINIC_OR_DEPARTMENT_OTHER): Payer: Self-pay | Admitting: Orthopedic Surgery

## 2022-06-12 ENCOUNTER — Other Ambulatory Visit: Payer: Self-pay

## 2022-06-12 ENCOUNTER — Ambulatory Visit (HOSPITAL_BASED_OUTPATIENT_CLINIC_OR_DEPARTMENT_OTHER): Payer: PPO

## 2022-06-12 ENCOUNTER — Encounter (HOSPITAL_BASED_OUTPATIENT_CLINIC_OR_DEPARTMENT_OTHER)
Admission: RE | Disposition: A | Discharge status: Home / Self Care | Payer: Self-pay | Source: Home / Self Care | Attending: Orthopedic Surgery

## 2022-06-12 DIAGNOSIS — N189 Chronic kidney disease, unspecified: Secondary | ICD-10-CM | POA: Diagnosis not present

## 2022-06-12 DIAGNOSIS — X58XXXA Exposure to other specified factors, initial encounter: Secondary | ICD-10-CM | POA: Insufficient documentation

## 2022-06-12 DIAGNOSIS — I10 Essential (primary) hypertension: Secondary | ICD-10-CM | POA: Insufficient documentation

## 2022-06-12 DIAGNOSIS — E669 Obesity, unspecified: Secondary | ICD-10-CM | POA: Insufficient documentation

## 2022-06-12 DIAGNOSIS — S52552A Other extraarticular fracture of lower end of left radius, initial encounter for closed fracture: Secondary | ICD-10-CM | POA: Diagnosis not present

## 2022-06-12 DIAGNOSIS — I129 Hypertensive chronic kidney disease with stage 1 through stage 4 chronic kidney disease, or unspecified chronic kidney disease: Secondary | ICD-10-CM | POA: Diagnosis not present

## 2022-06-12 DIAGNOSIS — G4730 Sleep apnea, unspecified: Secondary | ICD-10-CM | POA: Diagnosis not present

## 2022-06-12 DIAGNOSIS — E785 Hyperlipidemia, unspecified: Secondary | ICD-10-CM | POA: Insufficient documentation

## 2022-06-12 DIAGNOSIS — S62102A Fracture of unspecified carpal bone, left wrist, initial encounter for closed fracture: Secondary | ICD-10-CM | POA: Diagnosis not present

## 2022-06-12 DIAGNOSIS — Z6827 Body mass index (BMI) 27.0-27.9, adult: Secondary | ICD-10-CM | POA: Diagnosis not present

## 2022-06-12 DIAGNOSIS — Z87891 Personal history of nicotine dependence: Secondary | ICD-10-CM | POA: Diagnosis not present

## 2022-06-12 DIAGNOSIS — Z01818 Encounter for other preprocedural examination: Secondary | ICD-10-CM

## 2022-06-12 DIAGNOSIS — I4891 Unspecified atrial fibrillation: Secondary | ICD-10-CM | POA: Insufficient documentation

## 2022-06-12 HISTORY — PX: ORIF WRIST FRACTURE: SHX2133

## 2022-06-12 SURGERY — OPEN REDUCTION INTERNAL FIXATION (ORIF) WRIST FRACTURE
Anesthesia: Monitor Anesthesia Care | Site: Wrist | Laterality: Left

## 2022-06-12 MED ORDER — PROPOFOL 500 MG/50ML IV EMUL
INTRAVENOUS | Status: DC | PRN
  Administered 2022-06-12: 75 ug/kg/min via INTRAVENOUS

## 2022-06-12 MED ORDER — BACITRACIN ZINC 500 UNIT/GM EX OINT
TOPICAL_OINTMENT | CUTANEOUS | Status: AC
  Filled 2022-06-12: qty 2.7

## 2022-06-12 MED ORDER — CEFAZOLIN SODIUM-DEXTROSE 2-4 GM/100ML-% IV SOLN
2.0000 g | INTRAVENOUS | Status: AC
  Administered 2022-06-12: 2 g via INTRAVENOUS

## 2022-06-12 MED ORDER — ACETAMINOPHEN 500 MG PO TABS
1000.0000 mg | ORAL_TABLET | Freq: Once | ORAL | Status: AC
  Administered 2022-06-12: 1000 mg via ORAL

## 2022-06-12 MED ORDER — FENTANYL CITRATE (PF) 100 MCG/2ML IJ SOLN
100.0000 ug | Freq: Once | INTRAMUSCULAR | Status: AC
  Administered 2022-06-12: 75 ug via INTRAVENOUS

## 2022-06-12 MED ORDER — ACETAMINOPHEN 500 MG PO TABS
ORAL_TABLET | ORAL | Status: AC
  Filled 2022-06-12: qty 2

## 2022-06-12 MED ORDER — LIDOCAINE-EPINEPHRINE (PF) 1.5 %-1:200000 IJ SOLN
INTRAMUSCULAR | Status: DC | PRN
  Administered 2022-06-12: 10 mL via PERINEURAL

## 2022-06-12 MED ORDER — 0.9 % SODIUM CHLORIDE (POUR BTL) OPTIME
TOPICAL | Status: DC | PRN
  Administered 2022-06-12: 1000 mL

## 2022-06-12 MED ORDER — OXYCODONE HCL 5 MG/5ML PO SOLN: 5.0000 mg | Freq: Once | ORAL | Status: AC | PRN

## 2022-06-12 MED ORDER — FENTANYL CITRATE (PF) 100 MCG/2ML IJ SOLN
INTRAMUSCULAR | Status: AC
  Filled 2022-06-12: qty 2

## 2022-06-12 MED ORDER — CEFAZOLIN SODIUM-DEXTROSE 2-4 GM/100ML-% IV SOLN
INTRAVENOUS | Status: AC
  Filled 2022-06-12: qty 100

## 2022-06-12 MED ORDER — OXYCODONE-ACETAMINOPHEN 5-325 MG PO TABS: 1.0000 | ORAL_TABLET | Freq: Four times a day (QID) | ORAL | 0 refills | Status: AC | PRN

## 2022-06-12 MED ORDER — BUPIVACAINE HCL (PF) 0.25 % IJ SOLN
INTRAMUSCULAR | Status: DC | PRN
  Administered 2022-06-12: 10 mL

## 2022-06-12 MED ORDER — BUPIVACAINE HCL (PF) 0.5 % IJ SOLN
INTRAMUSCULAR | Status: DC | PRN
  Administered 2022-06-12: 20 mL via PERINEURAL

## 2022-06-12 MED ORDER — LACTATED RINGERS IV SOLN: INTRAVENOUS | Status: DC

## 2022-06-12 MED ORDER — DEXAMETHASONE SODIUM PHOSPHATE 10 MG/ML IJ SOLN
INTRAMUSCULAR | Status: DC | PRN
  Administered 2022-06-12: 4 mg via INTRAVENOUS

## 2022-06-12 MED ORDER — FENTANYL CITRATE (PF) 100 MCG/2ML IJ SOLN
INTRAMUSCULAR | Status: DC | PRN
  Administered 2022-06-12 (×4): 25 ug via INTRAVENOUS

## 2022-06-12 MED ORDER — ONDANSETRON HCL 4 MG/2ML IJ SOLN
INTRAMUSCULAR | Status: DC | PRN
  Administered 2022-06-12: 4 mg via INTRAVENOUS

## 2022-06-12 MED ORDER — FENTANYL CITRATE (PF) 100 MCG/2ML IJ SOLN: 25.0000 ug | INTRAMUSCULAR | Status: DC | PRN

## 2022-06-12 MED ORDER — PROPOFOL 10 MG/ML IV BOLUS
INTRAVENOUS | Status: DC | PRN
  Administered 2022-06-12: 150 mg via INTRAVENOUS

## 2022-06-12 MED ORDER — BACITRACIN ZINC 500 UNIT/GM EX OINT
TOPICAL_OINTMENT | CUTANEOUS | Status: DC | PRN
  Administered 2022-06-12: 1 via TOPICAL

## 2022-06-12 MED ORDER — MIDAZOLAM HCL 2 MG/2ML IJ SOLN
INTRAMUSCULAR | Status: AC
  Filled 2022-06-12: qty 2

## 2022-06-12 MED ORDER — LIDOCAINE-EPINEPHRINE (PF) 1.5 %-1:200000 IJ SOLN: INTRAMUSCULAR | Status: DC | PRN

## 2022-06-12 MED ORDER — OXYCODONE HCL 5 MG PO TABS
5.0000 mg | ORAL_TABLET | Freq: Once | ORAL | Status: AC | PRN
  Administered 2022-06-12: 5 mg via ORAL

## 2022-06-12 MED ORDER — OXYCODONE HCL 5 MG PO TABS
ORAL_TABLET | ORAL | Status: AC
  Filled 2022-06-12: qty 1

## 2022-06-12 MED ORDER — AMISULPRIDE (ANTIEMETIC) 5 MG/2ML IV SOLN: 10.0000 mg | Freq: Once | INTRAVENOUS | Status: DC | PRN

## 2022-06-12 MED ORDER — LIDOCAINE HCL (CARDIAC) PF 100 MG/5ML IV SOSY
PREFILLED_SYRINGE | INTRAVENOUS | Status: DC | PRN
  Administered 2022-06-12: 30 mg via INTRAVENOUS

## 2022-06-12 SURGICAL SUPPLY — 45 items
BIT DRILL 2.2 SS TIBIAL (BIT) IMPLANT
BLADE SURG 15 STRL LF DISP TIS (BLADE) ×1 IMPLANT
BLADE SURG 15 STRL SS (BLADE) ×1
BNDG CMPR 9X4 STRL LF SNTH (GAUZE/BANDAGES/DRESSINGS) ×1
BNDG ELASTIC 4X5.8 VLCR STR LF (GAUZE/BANDAGES/DRESSINGS) ×1 IMPLANT
BNDG ESMARK 4X9 LF (GAUZE/BANDAGES/DRESSINGS) ×1 IMPLANT
BNDG PLASTER X FAST 4X5 WHT LF (CAST SUPPLIES) IMPLANT
BNDG PLSTR 5X4 XFST ST WHT LF (CAST SUPPLIES) ×2
COVER BACK TABLE 60X90IN (DRAPES) ×1 IMPLANT
CUFF TOURN SGL QUICK 18X4 (TOURNIQUET CUFF) ×1 IMPLANT
DRAPE EXTREMITY T 121X128X90 (DISPOSABLE) ×1 IMPLANT
DRAPE OEC MINIVIEW 54X84 (DRAPES) ×1 IMPLANT
DRAPE SURG 17X23 STRL (DRAPES) ×1 IMPLANT
DRSG EMULSION OIL 3X3 NADH (GAUZE/BANDAGES/DRESSINGS) ×1 IMPLANT
GAUZE SPONGE 4X4 12PLY STRL (GAUZE/BANDAGES/DRESSINGS) ×1 IMPLANT
GLOVE BIO SURGEON STRL SZ7.5 (GLOVE) ×1 IMPLANT
GLOVE BIOGEL PI IND STRL 7.5 (GLOVE) ×1 IMPLANT
GLOVE SURG SS PI 7.0 STRL IVOR (GLOVE) IMPLANT
GOWN STRL REUS W/ TWL LRG LVL3 (GOWN DISPOSABLE) ×1 IMPLANT
GOWN STRL REUS W/TWL LRG LVL3 (GOWN DISPOSABLE) ×1
GOWN STRL REUS W/TWL XL LVL3 (GOWN DISPOSABLE) ×1 IMPLANT
NEEDLE HYPO 22GX1.5 SAFETY (NEEDLE) ×1 IMPLANT
NS IRRIG 1000ML POUR BTL (IV SOLUTION) ×1 IMPLANT
PACK BASIN DAY SURGERY FS (CUSTOM PROCEDURE TRAY) ×1 IMPLANT
PADDING CAST ABS COTTON 4X4 ST (CAST SUPPLIES) ×1 IMPLANT
PEG LOCKING SMOOTH 2.2X16 (Screw) IMPLANT
PEG LOCKING SMOOTH 2.2X18 (Peg) IMPLANT
PEG LOCKING SMOOTH 2.2X20 (Screw) IMPLANT
PEG LOCKING SMOOTH 2.2X22 (Screw) IMPLANT
PLATE STANDARD DVR LEFT (Plate) ×1 IMPLANT
PLATE STD DVR LT 24X51 (Plate) IMPLANT
SCREW  LP NL 2.7X16MM (Screw) ×1 IMPLANT
SCREW LOCK 16X2.7X 3 LD TPR (Screw) IMPLANT
SCREW LOCK 18X2.7X 3 LD TPR (Screw) IMPLANT
SCREW LOCKING 2.7X15MM (Screw) IMPLANT
SCREW LOCKING 2.7X16 (Screw) ×1 IMPLANT
SCREW LOCKING 2.7X18 (Screw) ×1 IMPLANT
SCREW LP NL 2.7X16MM (Screw) IMPLANT
SUT ETHILON 4 0 PS 2 18 (SUTURE) ×1 IMPLANT
SYR 10ML LL (SYRINGE) ×1 IMPLANT
SYR BULB EAR ULCER 3OZ GRN STR (SYRINGE) ×1 IMPLANT
TOWEL GREEN STERILE FF (TOWEL DISPOSABLE) ×2 IMPLANT
TRAY DSU PREP LF (CUSTOM PROCEDURE TRAY) ×1 IMPLANT
TUBE CONNECTING 20X1/4 (TUBING) IMPLANT
UNDERPAD 30X36 HEAVY ABSORB (UNDERPADS AND DIAPERS) ×1 IMPLANT

## 2022-06-12 NOTE — Op Note (Signed)
OPERATIVE NOTE  DATE OF PROCEDURE: 06/12/2022  SURGEONS: Primary: Orene Desanctis, MD  PREOPERATIVE DIAGNOSIS: Left wrist fracture  POSTOPERATIVE DIAGNOSIS: Same  NAME OF PROCEDURE:    Left distal radius open reduction internal fixation, extra- articular, 2 fragments 2.    Left wrist brachioradialis tenotomy  3.    Left wrist radiographs four views with intraoperative interpretation   ANESTHESIA: Regional Block + MAC  SKIN PREPARATION: Hibiclens  ESTIMATED BLOOD LOSS: Minimal  IMPLANTS: Biomet DVR Crosslock volar plate and screws  Implant Name Type Inv. Item Serial No. Manufacturer Lot No. LRB No. Used Action  PLATE STANDARD DVR LEFT - SOPEND ON STERILE TRA Plate PLATE STANDARD DVR LEFT OPEND ON STERILE TRA [employer](ORTH,TRAU,BIO,SG)  Left 1 Implanted  SCREW  LP NL [id number] - SOPENED ON STERILE TRAY Screw SCREW  LP NL 2.7X16MM OPENED ON STERILE TRAY [employer](ORTH,TRAU,BIO,SG)  Left 1 Implanted    INDICATIONS:  Allen Pierce is a 82 y.o. male who has the above preoperative diagnosis. The patient has decided to proceed with surgical intervention.  Risks, benefits and alternatives of operative management were discussed including, but not limited to, risks of anesthesia complications, infection, pain, persistent symptoms, stiffness, need for future surgery.  The patient understands, agrees and elects to proceed with surgery.    DESCRIPTION OF PROCEDURE: The patient was met in the pre-operative area and their identity was verified.  The operative location and laterality was also verified and marked.  The patient was brought to the OR and was placed supine on the table.  After repeat patient identification with the operative team anesthesia was provided and the patient was prepped and draped in the usual sterile fashion.  A final timeout was performed verifying the correction patient, procedure, location and laterality.  Preoperative antibiotics were administered. The left upper extremity was  exsanguinated with an Esmarch and tourniquet inflated to 260mHg. Under loupe magnification, an incision was made directly over the flexor carpi radialis (FCR) tendon. Bipolar was utilized for hemostasis. The roof of the FCR tendon sheath was incised. The FCR tendon was then retracted ulnarly to protect the palmar cutaneous branch of the median nerve. Subsequently, the floor of the FCR tendon sheath was incised over the distal end of the radius.  The flexor pollicis longus (FPL) was swept ulnarly to reveal the pronator quadratus.  The pronator quadratus fascia was incised from its distal and radial borders. A periosteal elevator was utilized to mobilize the pronator quadratus muscle off the distal radius.  The fracture site was irrigated and prepared for reduction with a freer and adson forceps. There were 2 distal radius fracture fragments. The brachioradialis tendon insertion was released to facilitate reduction.  This release was performed by identifying the broad insertion of the brachiradialis tendon and also identifying the 1st dorsal compartment tendons.  The 1st dorsal compartment tendons were protected, the broad tendon insertion was released under direct visualization. The fracture was reduced and provisionally fixed with K-wires. We then selected a proper length and width volar plate.  The plate was placed on the distal end of the radius with the fracture reduced and secured to the bone. Using mini C-arm the fracture reduction and position of the plate were deemed to be satisfactory.  We proceeded with securing the plate to the radius with the 1 bicortical nonlocking screw in the oblong portion of the shaft.  With the intermediate column reduced, we secured the distal end of the plate with 2 screws in the distal ulnar portion of  plate.  We again used the C-arm to verify satisfactory plate position as well as fracture reduction. The radial column was then reduced and radial styloid locking screws were  placed. The remainder shaft screws were placed through the plate. We used the mini C-arm to verify satisfactory plate position, screw lengths and fracture reduction. The DRUJ was then tested in neutral, pronation and supination and was found to be stable.The tourniquet was deflated. Meticulous hemostasis was obtained. The incision was copiously irrigated with normal saline and closed with interrupted 4-0 nylon horizontal mattress sutures. The incision was covered with xeroform, sterile guaze, webril and well padded short arm splint. The fingers were pink, warm and well perfused. All counts were correct. The patient was awoken from anesthesia and brought to PACU for recovery in stable condition.   Allen Holmes, MD Orthopaedic Hand Surgery

## 2022-06-12 NOTE — Progress Notes (Signed)
Assisted Dr. Kalman Shan with left, infraclavicular, ultrasound guided block. Side rails up, monitors on throughout procedure. See vital signs in flow sheet. Tolerated Procedure well.

## 2022-06-12 NOTE — Transfer of Care (Signed)
Immediate Anesthesia Transfer of Care Note  Patient: Allen Pierce  Procedure(s) Performed: OPEN REDUCTION INTERNAL FIXATION (ORIF) WRIST FRACTURE (Left: Wrist)  Patient Location: PACU  Anesthesia Type:GA combined with regional for post-op pain  Level of Consciousness: sedated  Airway & Oxygen Therapy: Patient Spontanous Breathing and Patient connected to face mask oxygen  Post-op Assessment: Report given to RN and Post -op Vital signs reviewed and stable  Post vital signs: Reviewed and stable  Last Vitals:  Vitals Value Taken Time  BP    Temp 36.6 C 06/12/22 1628  Pulse 72 06/12/22 1629  Resp 11 06/12/22 1629  SpO2 98 % 06/12/22 1629  Vitals shown include unvalidated device data.  Last Pain:  Vitals:   06/12/22 0807  TempSrc: Oral  PainSc: 4       Patients Stated Pain Goal: 4 (95/07/22 5750)  Complications: No notable events documented.

## 2022-06-12 NOTE — Progress Notes (Signed)
Surgery delayed by Dr Zenia Resides, anesthesiologist and Dr Greta Doom, surgeon, to 1500 secondary to patient eating at 0700.

## 2022-06-12 NOTE — Interval H&P Note (Signed)
History and Physical Interval Note:  06/12/2022 2:52 PM  Allen Pierce  has presented today for surgery, with the diagnosis of Left wrist fracture.  The various methods of treatment have been discussed with the patient and family. After consideration of risks, benefits and other options for treatment, the patient has consented to  Procedure(s) with comments: OPEN REDUCTION INTERNAL FIXATION (ORIF) WRIST FRACTURE (Left) - regional with MAC as a surgical intervention.  The patient's history has been reviewed, patient examined, no change in status, stable for surgery.  I have reviewed the patient's chart and labs.  Questions were answered to the patient's satisfaction.     Orene Desanctis

## 2022-06-12 NOTE — Anesthesia Procedure Notes (Signed)
Anesthesia Regional Block: Supraclavicular block   Pre-Anesthetic Checklist: , timeout performed,  Correct Patient, Correct Site, Correct Laterality,  Correct Procedure, Correct Position, site marked,  Risks and benefits discussed,  Surgical consent,  Pre-op evaluation,  At surgeon's request and post-op pain management  Laterality: Left  Prep: chloraprep       Needles:  Injection technique: Single-shot  Needle Type: Echogenic Needle     Needle Length: 9cm      Additional Needles:   Procedures:,,,, ultrasound used (permanent image in chart),,    Narrative:  Start time: 06/12/2022 2:20 PM End time: 06/12/2022 2:29 PM Injection made incrementally with aspirations every 5 mL.  Performed by: Personally  Anesthesiologist: Myrtie Soman, MD  Additional Notes: Patient tolerated the procedure well without complications

## 2022-06-12 NOTE — Anesthesia Procedure Notes (Signed)
Anesthesia Procedure Image    

## 2022-06-12 NOTE — Discharge Instructions (Addendum)
Orthopaedic Hand Surgery Discharge Instructions  WEIGHT BEARING STATUS: Non weight bearing on operative extremity  DRESSING CARE: Please keep your dressing/splint/cast clean and dry until your follow-up appointment. You may shower by placing a waterproof covering over your dressing/splint/cast. Contact your surgeon if your splint/cast gets wet. It will need to be changed to prevent skin breakdown.  PAIN CONTROL: First line medications for post operative pain control are Tylenol (acetaminophen) and Motrin (ibuprofen) if you are able to take these medications. If you have been prescribed a medication these can be taken as breakthrough pain medications. Please note that some narcotic pain medication has acetaminophen added and you should never consume more than 4,000mg of acetaminophen in 24-hour period. Please note that if you are given Toradol (ketorolac) you should not take similar medications such as ibuprofen or naproxen.  DISCHARGE MEDICATIONS: If you have been prescribed medication it was sent electronically to your pharmacy. No changes have been made to your home medications.  ICE/ELEVATION: Ice and elevate your injured extremity as needed. Avoid direct contact of ice with skin.   BANDAGE FEELS TOO TIGHT: If your bandage feels too tight, first make sure you are elevating your fingers as much as possible. The outer layer of the bandage can be unwrapped and reapplied more loosely. If no improvement, you may carefully cut the inner layer longitudinally until the pressure has resolved and then rewrap the outer layer. If you are not comfortable with these instructions, please call the office and the bandage can be changed for you.   FOLLOW UP: You will be called after surgery with an appointment date and time, however if you have not received a phone call within 3 days, please call during regular office hours at [phone] to schedule a post operative appointment.  Please Seek Medical Attention  if: Call MD for: pain or pressure in chest, jaw, arm, back, neck  Call MD for: temperature greater than 101 F for more than 24 hrs Call MD for: difficulty breathing Call MD for: incision redness, bleeding, drainage  Call MD for: palpitations or feeling that the heart is racing  Call MD for: increased swelling in arm, leg, ankle, or abdomen  Call MD for: lightheadedness, dizziness, fainting Call 911 or go to ER for any medical emergency if you are not able to get in touch with your doctor   J. Reid Spears, MD Orthopaedic Hand Surgeon [facility] Office number: [phone] [address] [place], [place] [address]   Post Anesthesia Home Care Instructions  Activity: Get plenty of rest for the remainder of the day. A responsible individual must stay with you for 24 hours following the procedure.  For the next 24 hours, DO NOT: -Drive a car -Operate machinery -Drink alcoholic beverages -Take any medication unless instructed by your physician -Make any legal decisions or sign important papers.  Meals: Start with liquid foods such as gelatin or soup. Progress to regular foods as tolerated. Avoid greasy, spicy, heavy foods. If nausea and/or vomiting occur, drink only clear liquids until the nausea and/or vomiting subsides. Call your physician if vomiting continues.  Special Instructions/Symptoms: Your throat may feel dry or sore from the anesthesia or the breathing tube placed in your throat during surgery. If this causes discomfort, gargle with warm salt water. The discomfort should disappear within 24 hours.  Regional Anesthesia Blocks  1. Numbness or the inability to move the "blocked" extremity may last from 3-48 hours after placement. The length of time depends on the medication injected   and your individual response to the medication. If the numbness is not going away after 48 hours, call your surgeon.  2. The extremity that is blocked will need to be protected until  the numbness is gone and the  Strength has returned. Because you cannot feel it, you will need to take extra care to avoid injury. Because it may be weak, you may have difficulty moving it or using it. You may not know what position it is in without looking at it while the block is in effect.  3. For blocks in the legs and feet, returning to weight bearing and walking needs to be done carefully. You will need to wait until the numbness is entirely gone and the strength has returned. You should be able to move your leg and foot normally before you try and bear weight or walk. You will need someone to be with you when you first try to ensure you do not fall and possibly risk injury.  4. Bruising and tenderness at the needle site are common side effects and will resolve in a few days.  5. Persistent numbness or new problems with movement should be communicated to the surgeon or the [facility] ([phone])/ [facility] ([phone]).   

## 2022-06-12 NOTE — Anesthesia Postprocedure Evaluation (Signed)
Anesthesia Post Note  Patient: Allen Pierce  Procedure(s) Performed: OPEN REDUCTION INTERNAL FIXATION (ORIF) WRIST FRACTURE (Left: Wrist)     Patient location during evaluation: PACU Anesthesia Type: General Level of consciousness: awake and alert Pain management: pain level controlled Vital Signs Assessment: post-procedure vital signs reviewed and stable Respiratory status: spontaneous breathing, nonlabored ventilation, respiratory function stable and patient connected to nasal cannula oxygen Cardiovascular status: blood pressure returned to baseline and stable Postop Assessment: no apparent nausea or vomiting Anesthetic complications: no  No notable events documented.  Last Vitals:  Vitals:   06/12/22 1645 06/12/22 1653  BP: (!) 161/80   Pulse: 70 73  Resp: 12 12  Temp:    SpO2: 94% 93%    Last Pain:  Vitals:   06/12/22 1645  TempSrc:   PainSc: 0-No pain                 [name]

## 2022-06-12 NOTE — Anesthesia Procedure Notes (Signed)
Procedure Name: LMA Insertion Date/Time: 06/12/2022 3:21 PM  Performed by: Leighana Neyman, Ernesta Amble, CRNAPre-anesthesia Checklist: Patient identified, Emergency Drugs available, Suction available and Patient being monitored Patient Re-evaluated:Patient Re-evaluated prior to induction Oxygen Delivery Method: Circle system utilized Preoxygenation: Pre-oxygenation with 100% oxygen Induction Type: IV induction Ventilation: Mask ventilation without difficulty LMA: LMA inserted LMA Size: 4.0 Number of attempts: 1 Airway Equipment and Method: Bite block Placement Confirmation: positive ETCO2 Tube secured with: Tape Dental Injury: Teeth and Oropharynx as per pre-operative assessment

## 2022-06-13 ENCOUNTER — Ambulatory Visit: Payer: PPO | Admitting: Podiatry

## 2022-06-13 ENCOUNTER — Encounter (HOSPITAL_BASED_OUTPATIENT_CLINIC_OR_DEPARTMENT_OTHER): Payer: Self-pay | Admitting: Orthopedic Surgery

## 2022-06-25 DIAGNOSIS — S52502S Unspecified fracture of the lower end of left radius, sequela: Secondary | ICD-10-CM | POA: Diagnosis not present

## 2022-06-28 ENCOUNTER — Institutional Professional Consult (permissible substitution): Payer: PPO | Admitting: Pulmonary Disease

## 2022-07-02 DIAGNOSIS — R42 Dizziness and giddiness: Secondary | ICD-10-CM | POA: Diagnosis not present

## 2022-07-03 DIAGNOSIS — M25642 Stiffness of left hand, not elsewhere classified: Secondary | ICD-10-CM | POA: Diagnosis not present

## 2022-07-05 IMAGING — US US RENAL
1 series · 14 of 25 positions shown · non-contrast
Comparison: 01/07/2017

CLINICAL DATA: Chronic renal disease

EXAM:
RENAL / URINARY TRACT ULTRASOUND COMPLETE

[Series 1: us renal · 14 of 80 slices shown]
[im 1/80]
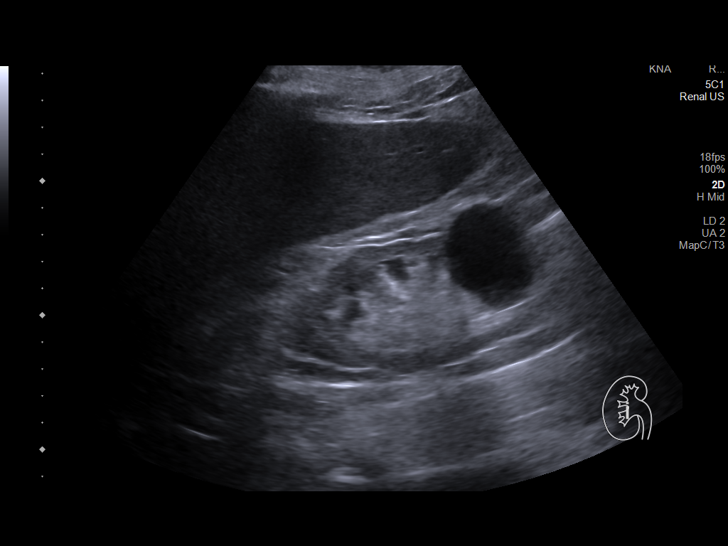
[im 7/80]
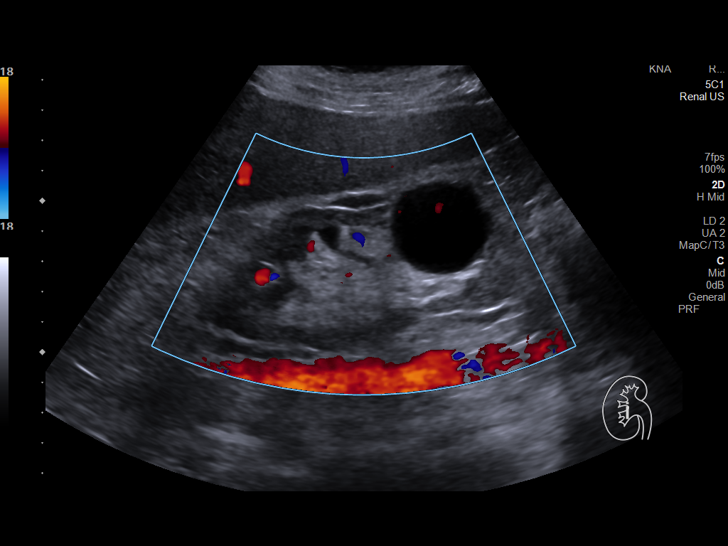
[im 14/80]
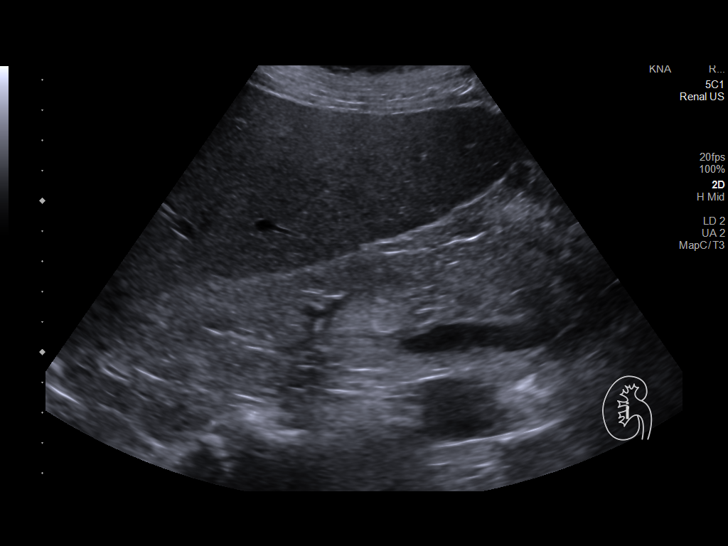
[im 20/80]
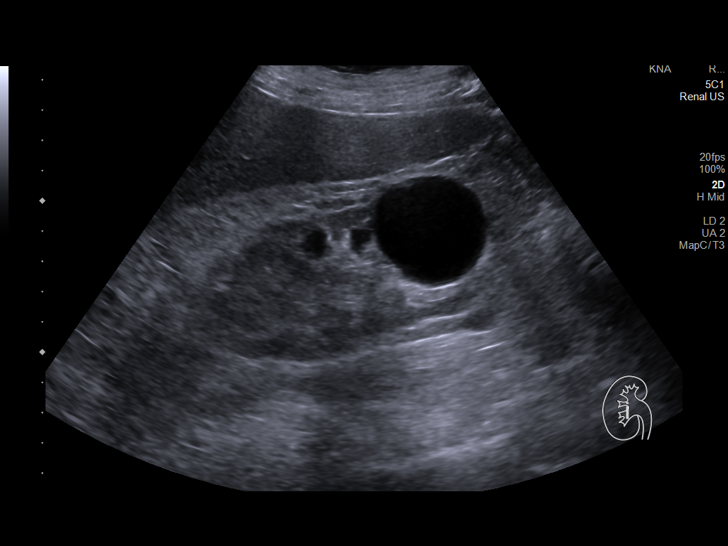
[im 27/80]
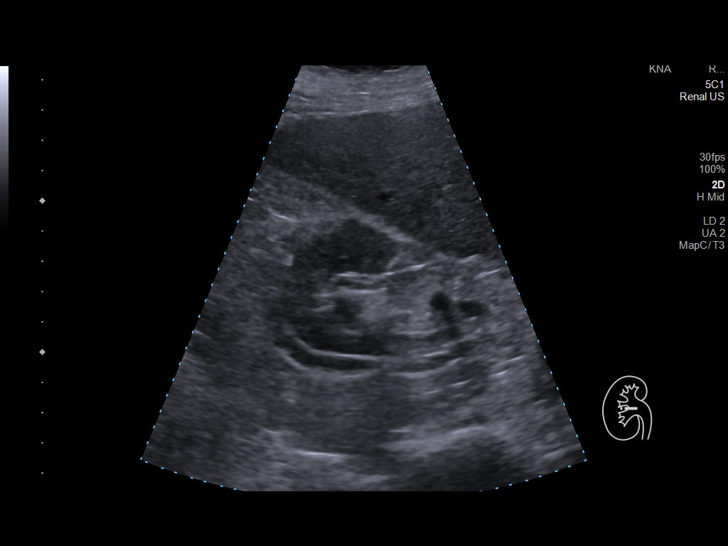
[im 30/80]
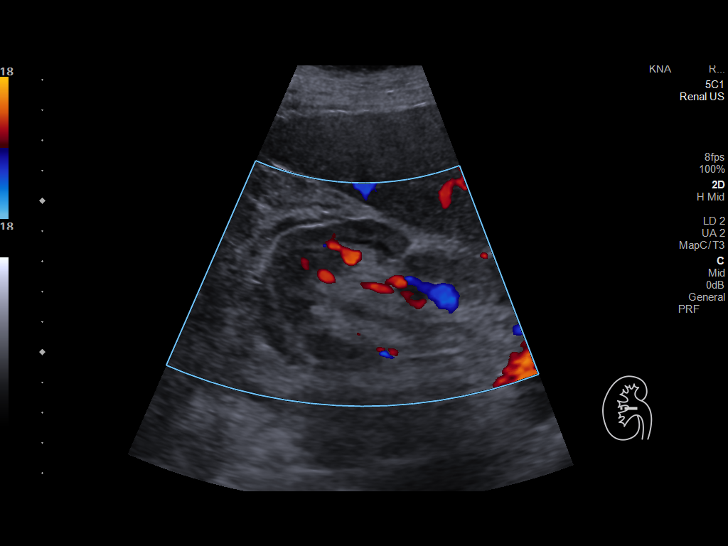
[im 37/80]
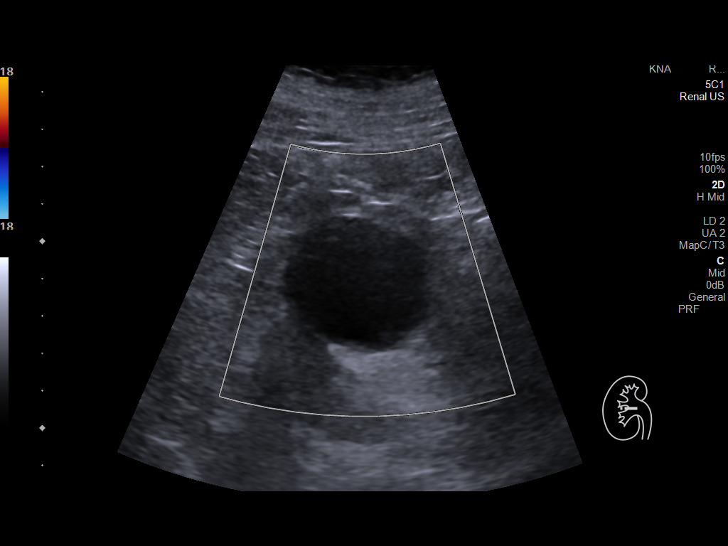
[im 43/80]
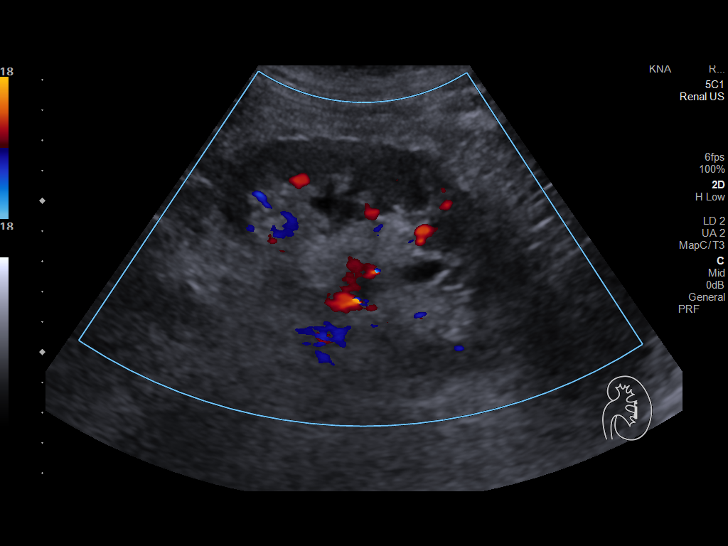
[im 50/80]
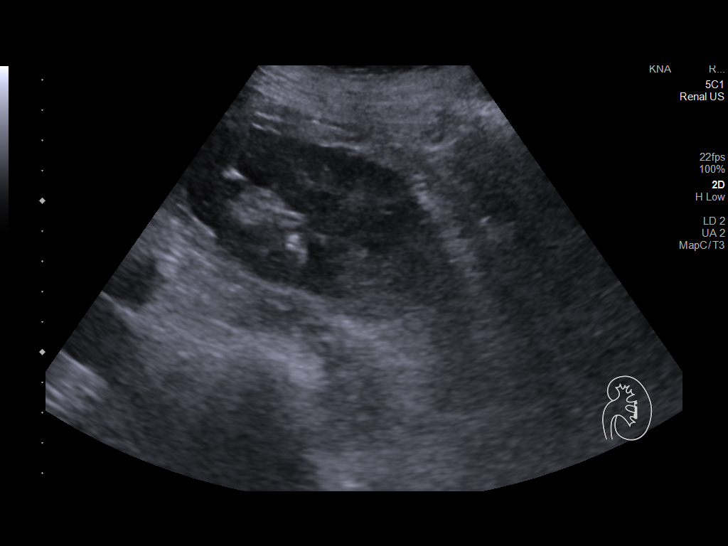
[im 53/80]
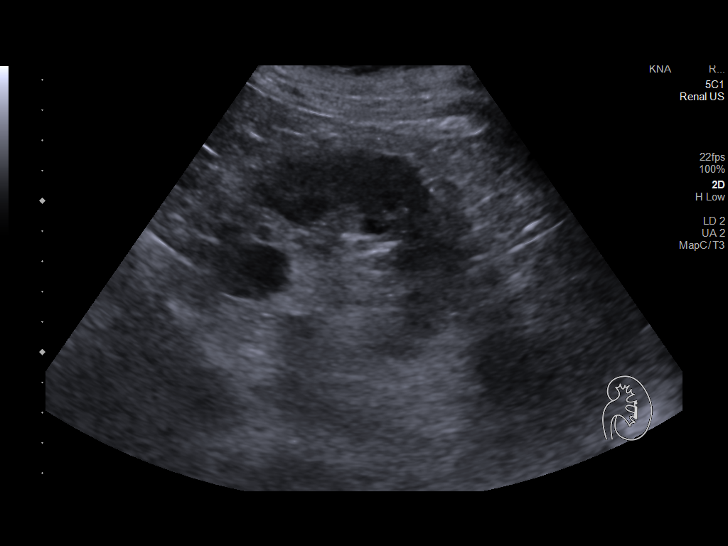
[im 60/80]
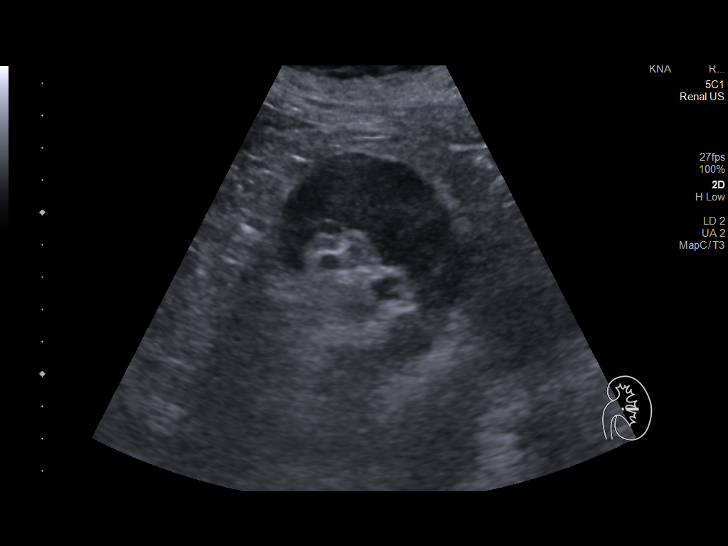
[im 66/80]
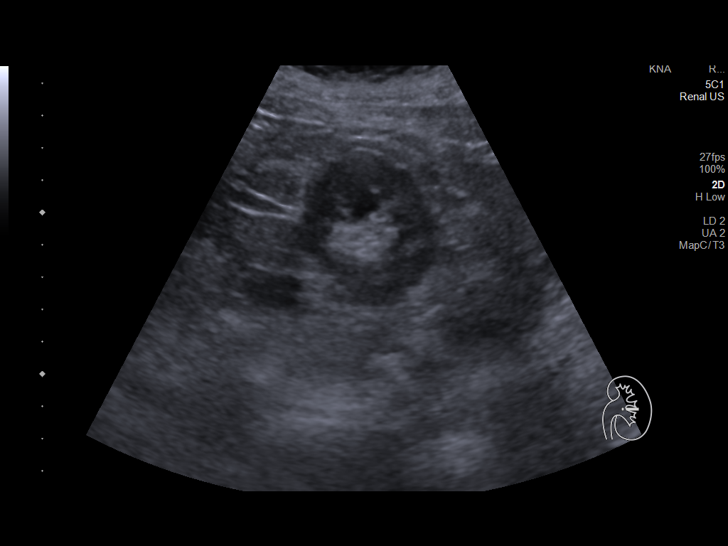
[im 73/80]
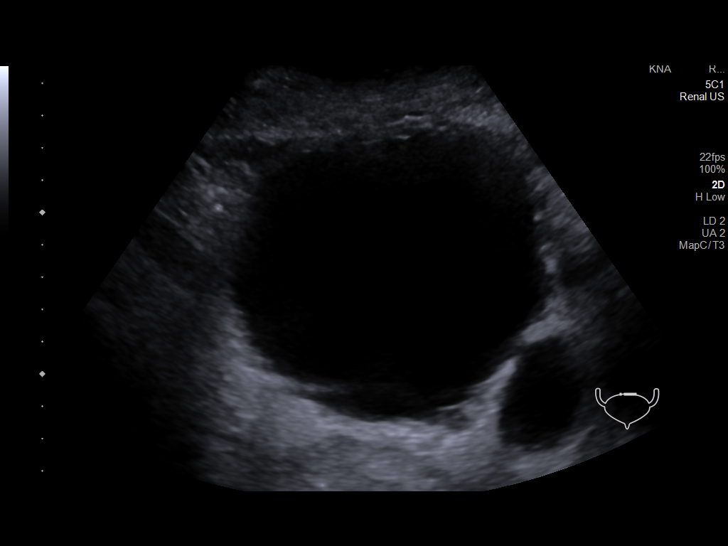
[im 80/80]
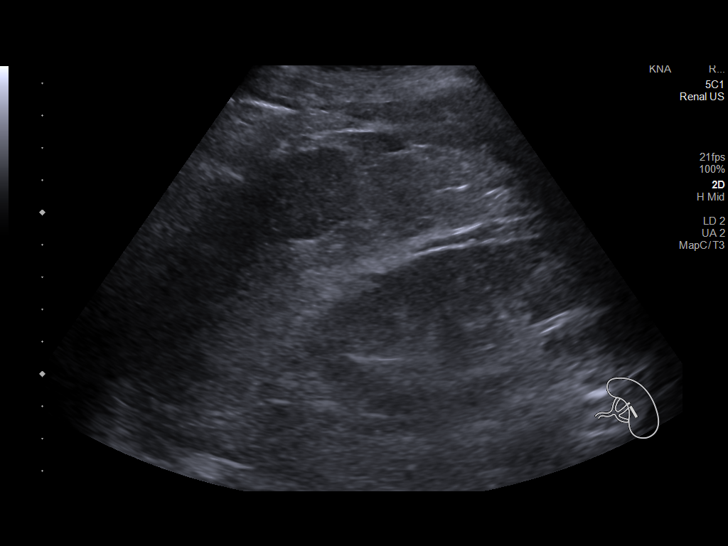

[14 of 25 positions shown; findings below may reference images not displayed]

FINDINGS: Right Kidney:

Renal measurements: 10.3 x 4.7 x 4.9 cm. = volume: 23 mL. 3.8 cm
cyst is noted in the lower pole similar to that seen on prior CT
examination. No mass lesion or hydronephrosis is noted. Mild
cortical thinning is seen.

Left Kidney:

Renal measurements: 12.1 x 6.8 x 5.1 cm. = volume: 19 mL.
Echogenicity within normal limits. No mass or hydronephrosis
visualized.

Bladder:

Bladder wall thickening is noted.  Small diverticulum is noted

Other:

None.
IMPRESSION: Right renal cyst and cortical thinning.

Small bladder diverticulum.

No other focal abnormality is noted.

## 2022-07-10 DIAGNOSIS — M25642 Stiffness of left hand, not elsewhere classified: Secondary | ICD-10-CM | POA: Diagnosis not present

## 2022-07-16 DIAGNOSIS — S52502S Unspecified fracture of the lower end of left radius, sequela: Secondary | ICD-10-CM | POA: Diagnosis not present

## 2022-07-16 DIAGNOSIS — M25642 Stiffness of left hand, not elsewhere classified: Secondary | ICD-10-CM | POA: Diagnosis not present

## 2022-07-24 DIAGNOSIS — M25642 Stiffness of left hand, not elsewhere classified: Secondary | ICD-10-CM | POA: Diagnosis not present

## 2022-07-30 DIAGNOSIS — R519 Headache, unspecified: Secondary | ICD-10-CM | POA: Diagnosis not present

## 2022-07-30 DIAGNOSIS — E041 Nontoxic single thyroid nodule: Secondary | ICD-10-CM | POA: Diagnosis not present

## 2022-07-30 DIAGNOSIS — R351 Nocturia: Secondary | ICD-10-CM | POA: Diagnosis not present

## 2022-07-31 ENCOUNTER — Other Ambulatory Visit: Payer: Self-pay | Admitting: Internal Medicine

## 2022-07-31 DIAGNOSIS — M25642 Stiffness of left hand, not elsewhere classified: Secondary | ICD-10-CM | POA: Diagnosis not present

## 2022-07-31 DIAGNOSIS — E041 Nontoxic single thyroid nodule: Secondary | ICD-10-CM

## 2022-08-06 DIAGNOSIS — L570 Actinic keratosis: Secondary | ICD-10-CM | POA: Diagnosis not present

## 2022-08-06 DIAGNOSIS — X32XXXD Exposure to sunlight, subsequent encounter: Secondary | ICD-10-CM | POA: Diagnosis not present

## 2022-08-09 DIAGNOSIS — M25642 Stiffness of left hand, not elsewhere classified: Secondary | ICD-10-CM | POA: Diagnosis not present

## 2022-08-16 DIAGNOSIS — M25642 Stiffness of left hand, not elsewhere classified: Secondary | ICD-10-CM | POA: Diagnosis not present

## 2022-08-23 DIAGNOSIS — M25642 Stiffness of left hand, not elsewhere classified: Secondary | ICD-10-CM | POA: Diagnosis not present

## 2022-08-27 DIAGNOSIS — S52502S Unspecified fracture of the lower end of left radius, sequela: Secondary | ICD-10-CM | POA: Diagnosis not present

## 2022-08-27 DIAGNOSIS — S52502D Unspecified fracture of the lower end of left radius, subsequent encounter for closed fracture with routine healing: Secondary | ICD-10-CM | POA: Diagnosis not present

## 2022-08-28 ENCOUNTER — Ambulatory Visit
Admission: RE | Admit: 2022-08-28 | Discharge: 2022-08-28 | Disposition: A | Discharge status: Home / Self Care | Payer: PPO | Source: Ambulatory Visit | Attending: Internal Medicine | Admitting: Internal Medicine

## 2022-08-28 DIAGNOSIS — E041 Nontoxic single thyroid nodule: Secondary | ICD-10-CM

## 2022-08-29 DIAGNOSIS — R351 Nocturia: Secondary | ICD-10-CM | POA: Diagnosis not present

## 2022-08-29 DIAGNOSIS — E041 Nontoxic single thyroid nodule: Secondary | ICD-10-CM | POA: Diagnosis not present

## 2022-08-30 ENCOUNTER — Other Ambulatory Visit: Payer: Self-pay | Admitting: Internal Medicine

## 2022-08-30 DIAGNOSIS — E041 Nontoxic single thyroid nodule: Secondary | ICD-10-CM

## 2022-09-06 DIAGNOSIS — E041 Nontoxic single thyroid nodule: Secondary | ICD-10-CM | POA: Diagnosis not present

## 2022-09-11 DIAGNOSIS — M25642 Stiffness of left hand, not elsewhere classified: Secondary | ICD-10-CM | POA: Diagnosis not present

## 2022-09-17 ENCOUNTER — Institutional Professional Consult (permissible substitution): Payer: PPO | Admitting: Pulmonary Disease

## 2022-09-19 ENCOUNTER — Institutional Professional Consult (permissible substitution): Payer: PPO | Admitting: Pulmonary Disease

## 2022-09-21 ENCOUNTER — Institutional Professional Consult (permissible substitution): Payer: PPO | Admitting: Pulmonary Disease

## 2022-09-25 ENCOUNTER — Other Ambulatory Visit (HOSPITAL_COMMUNITY)
Admission: RE | Admit: 2022-09-25 | Discharge: 2022-09-25 | Disposition: A | Discharge status: Home / Self Care | Payer: PPO | Source: Ambulatory Visit | Attending: Internal Medicine | Admitting: Internal Medicine

## 2022-09-25 ENCOUNTER — Ambulatory Visit
Admission: RE | Admit: 2022-09-25 | Discharge: 2022-09-25 | Disposition: A | Discharge status: Home / Self Care | Payer: PPO | Source: Ambulatory Visit | Attending: Internal Medicine | Admitting: Internal Medicine

## 2022-09-25 DIAGNOSIS — E041 Nontoxic single thyroid nodule: Secondary | ICD-10-CM | POA: Insufficient documentation

## 2022-09-25 NOTE — Procedures (Signed)
Successful US guided FNA of right inferior thyroid nodule No complications. See PACS for full report.    Alex Gardener, AGNP-BC 09/25/2022, 3:41 PM

## 2022-09-27 LAB — CYTOLOGY - NON PAP

## 2022-10-04 DIAGNOSIS — M25642 Stiffness of left hand, not elsewhere classified: Secondary | ICD-10-CM | POA: Diagnosis not present

## 2022-10-11 DIAGNOSIS — M25642 Stiffness of left hand, not elsewhere classified: Secondary | ICD-10-CM | POA: Diagnosis not present

## 2022-10-12 DIAGNOSIS — H9312 Tinnitus, left ear: Secondary | ICD-10-CM | POA: Diagnosis not present

## 2022-10-12 DIAGNOSIS — R519 Headache, unspecified: Secondary | ICD-10-CM | POA: Diagnosis not present

## 2022-10-12 DIAGNOSIS — H9192 Unspecified hearing loss, left ear: Secondary | ICD-10-CM | POA: Diagnosis not present

## 2022-10-16 DIAGNOSIS — M25642 Stiffness of left hand, not elsewhere classified: Secondary | ICD-10-CM | POA: Diagnosis not present

## 2022-10-19 ENCOUNTER — Encounter: Payer: Self-pay | Admitting: Neurology

## 2022-10-23 DIAGNOSIS — M25642 Stiffness of left hand, not elsewhere classified: Secondary | ICD-10-CM | POA: Diagnosis not present

## 2022-10-29 IMAGING — DX DG SHOULDER 2+V*R*
3 series · 3 of 3 positions shown · non-contrast
Comparison: None Available.

CLINICAL DATA: Chronic right shoulder pain.  No known injury.

EXAM:
RIGHT SHOULDER - 2+ VIEW

[shoulder ap (1 of 2)]
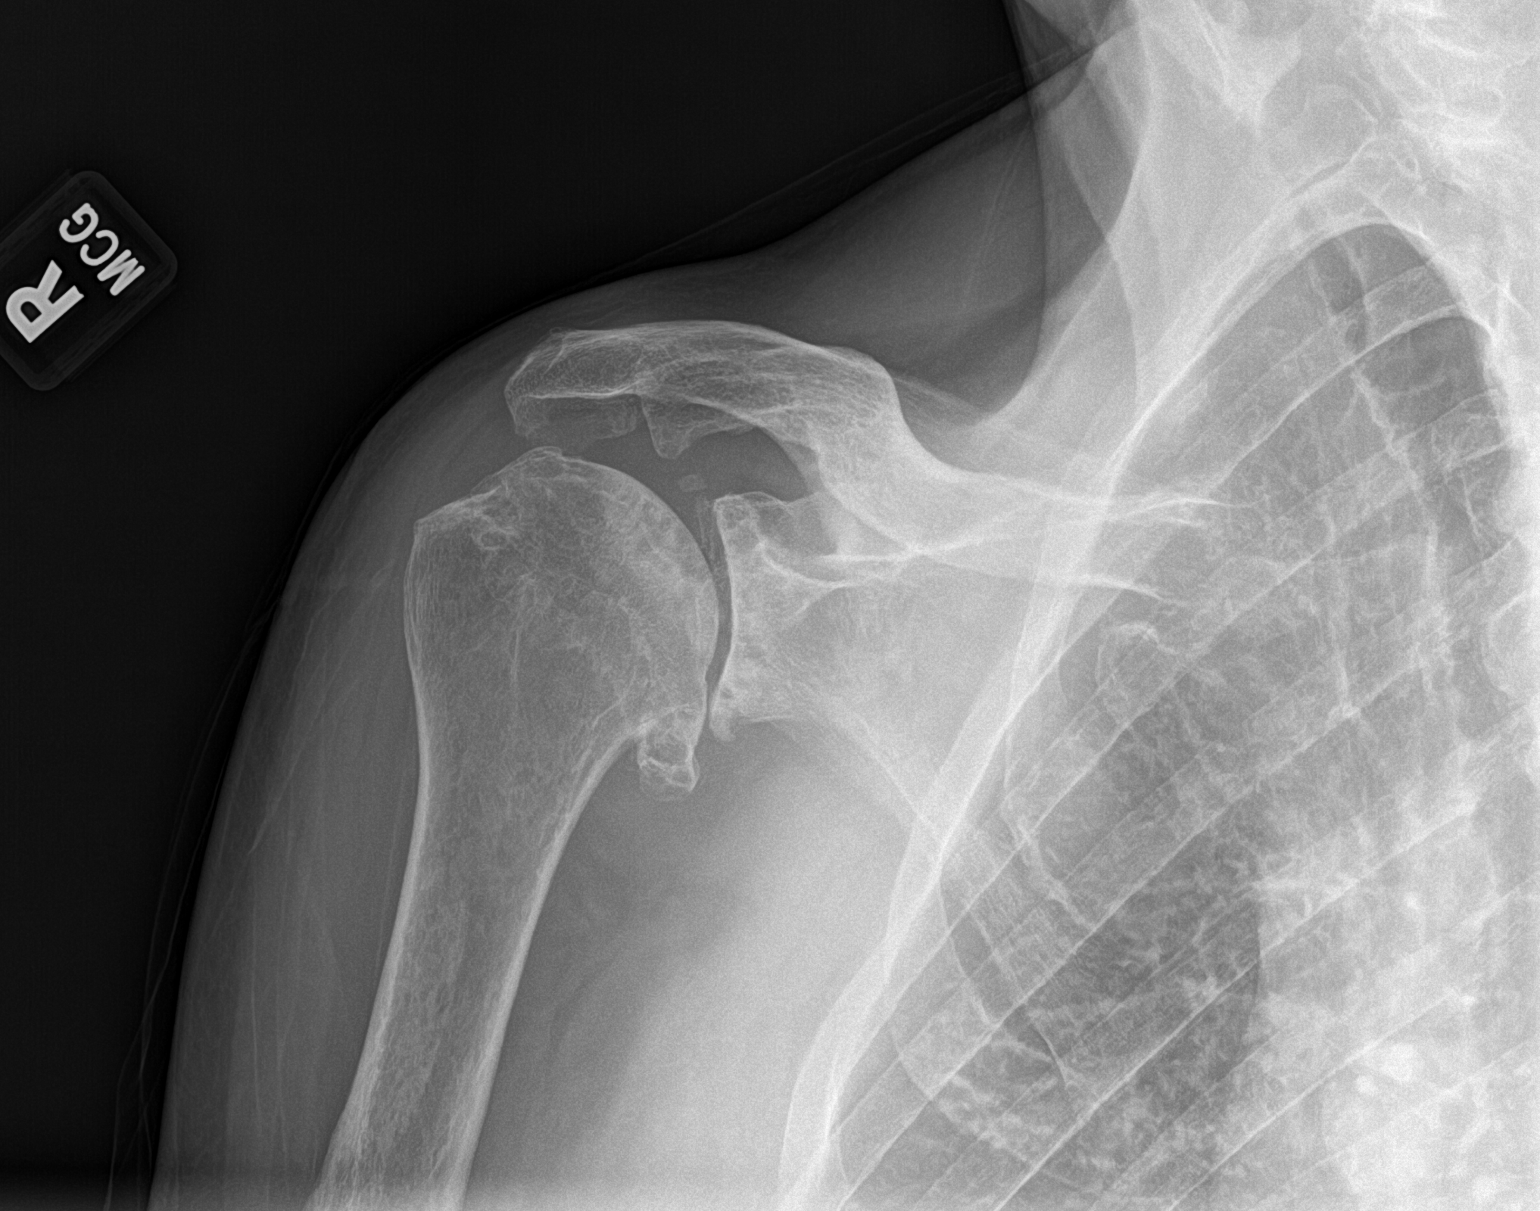

[shoulder ap (2 of 2)]
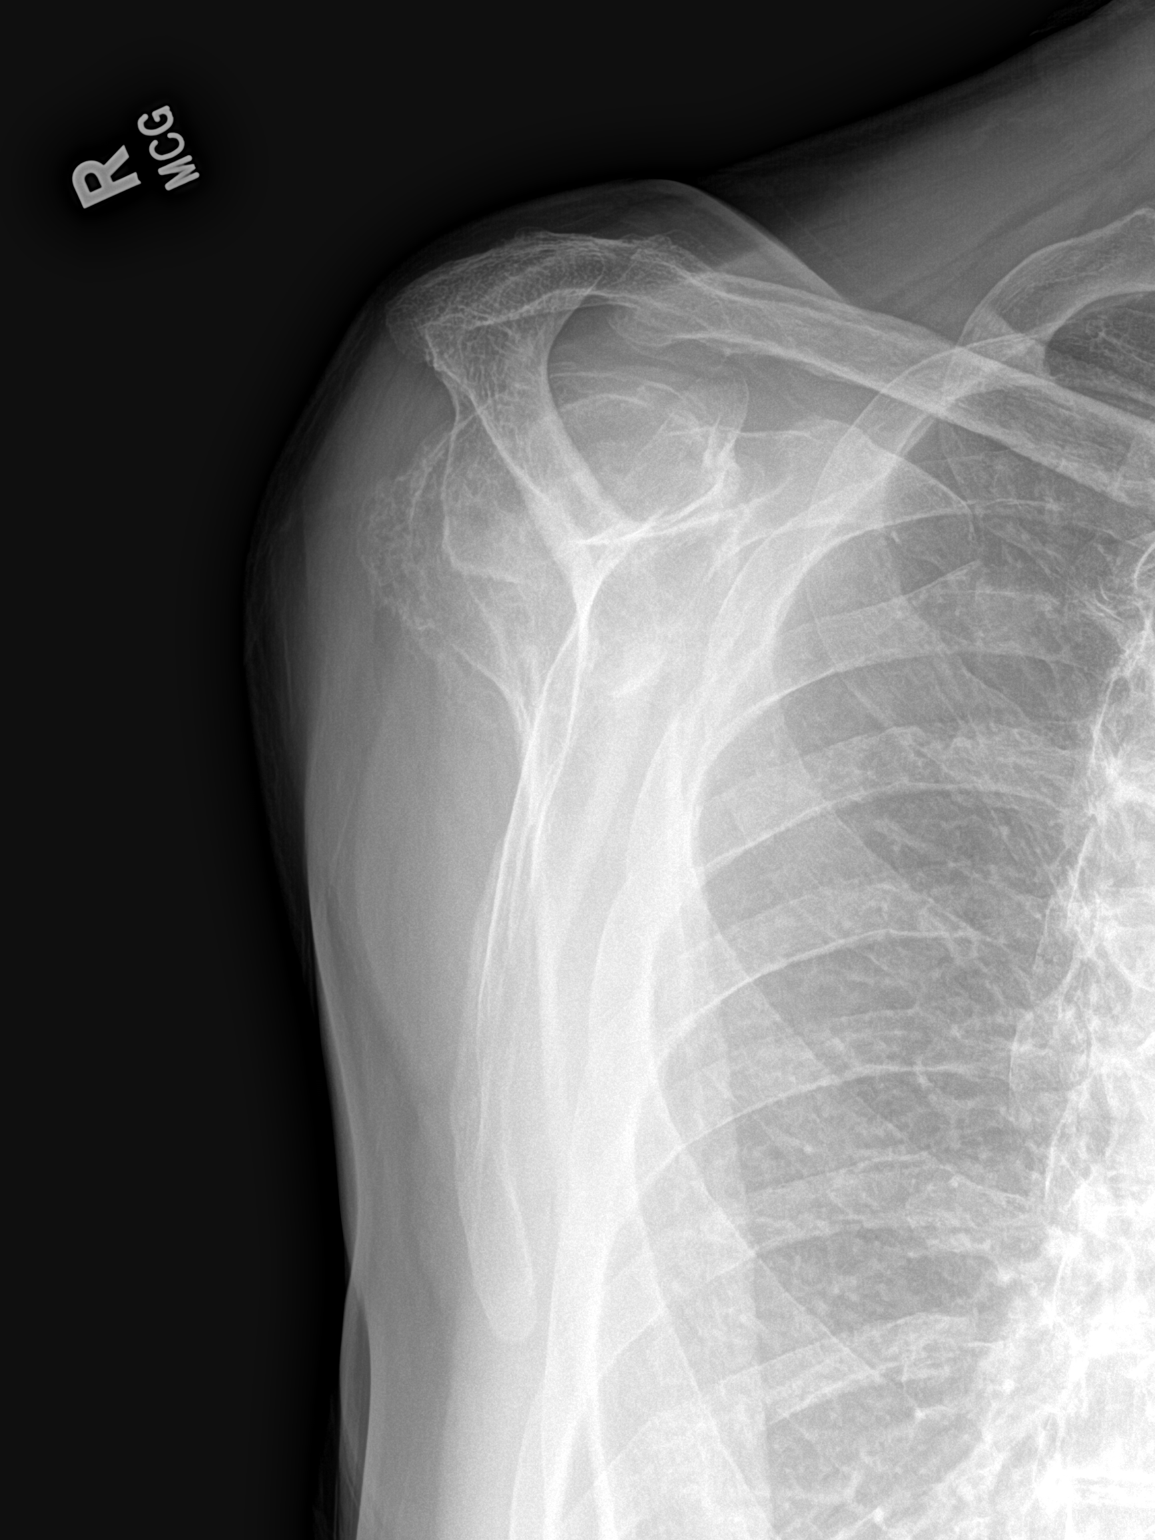

[shoulder axial]
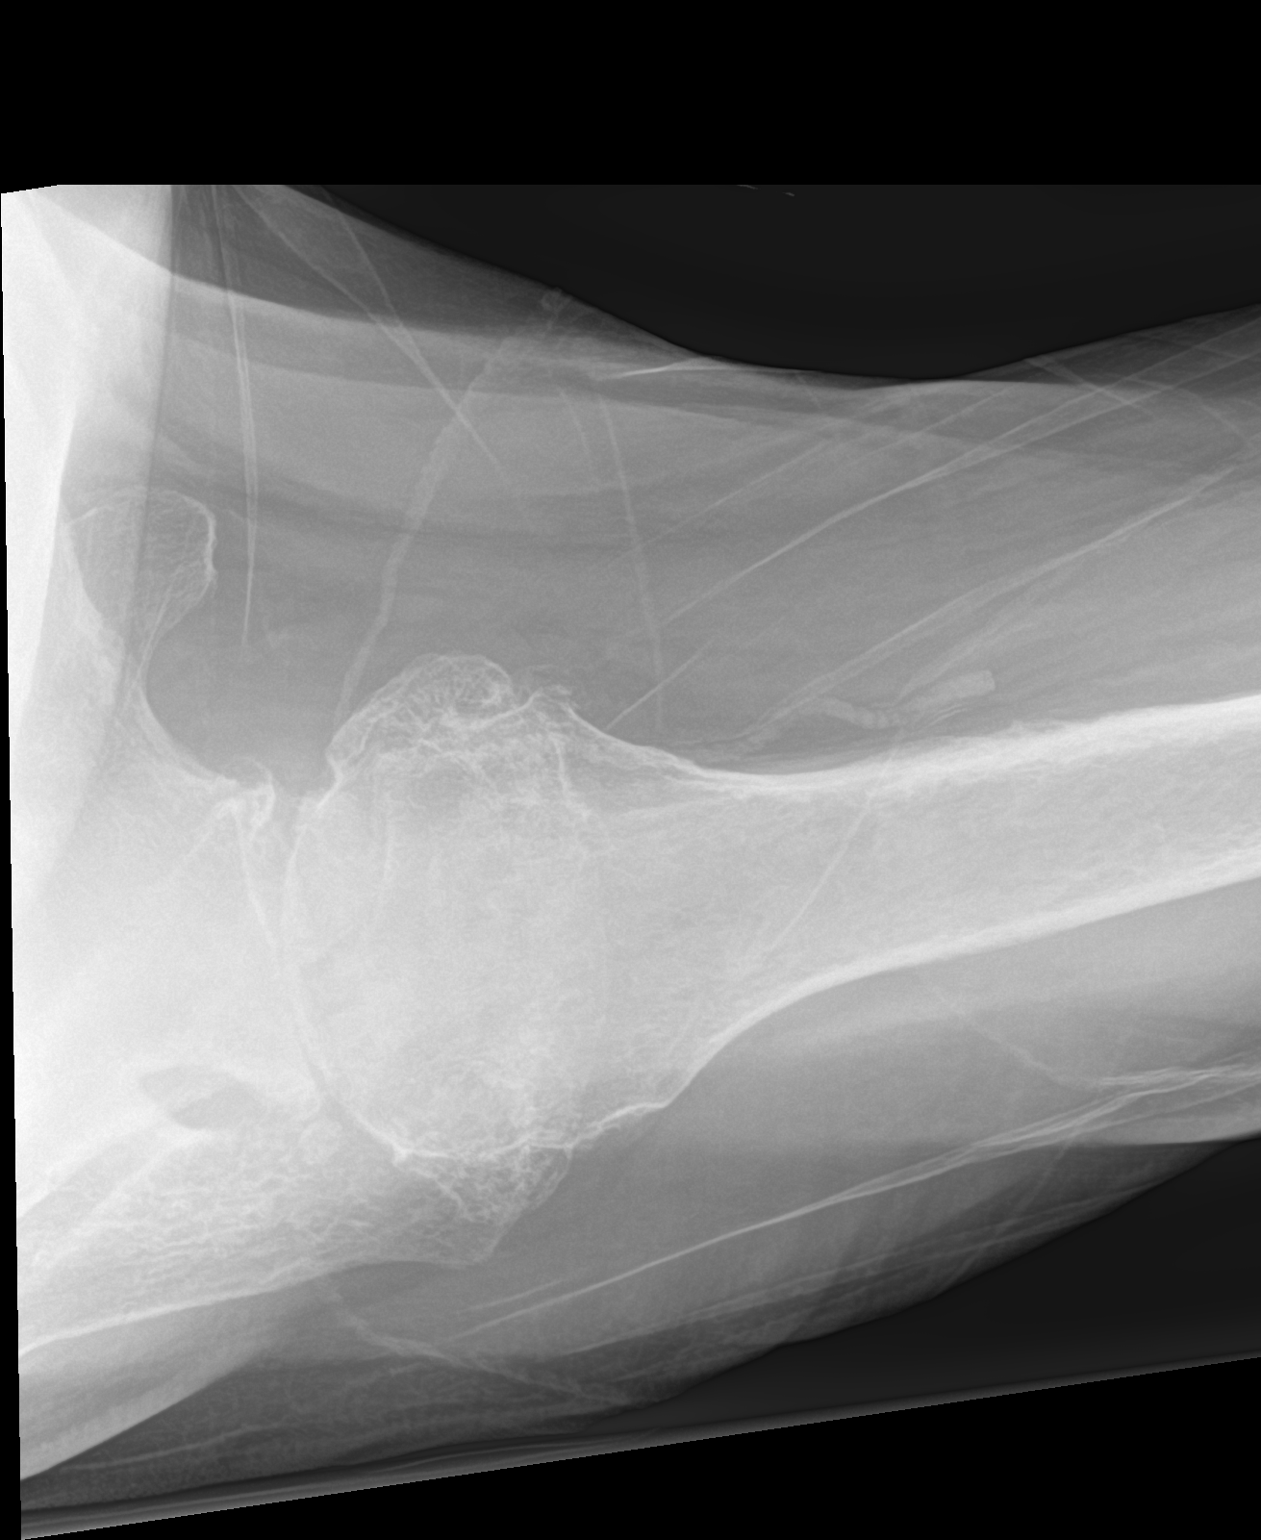

[3 of 3 positions shown; findings below may reference images not displayed]

FINDINGS: Severe glenohumeral joint space narrowing subchondral sclerosis and
subchondral degenerative cystic changes. Large inferior humeral
head-neck junction degenerative osteophytosis. A small chronic
ossicle is seen just posterior to the glenoid on axillary view.
Moderate inferior greater than superior glenoid degenerative
spurring. Tiny ossicle superior to the glenoid on frontal view.

Moderate acromioclavicular joint space narrowing and peripheral
osteophytosis.

No acute fracture or dislocation.

The visualized portion of the right lung is unremarkable.
IMPRESSION: Severe glenohumeral and moderate acromioclavicular osteoarthritis.

## 2022-11-06 DIAGNOSIS — M25642 Stiffness of left hand, not elsewhere classified: Secondary | ICD-10-CM | POA: Diagnosis not present

## 2022-11-07 DIAGNOSIS — R351 Nocturia: Secondary | ICD-10-CM | POA: Diagnosis not present

## 2022-11-07 DIAGNOSIS — N401 Enlarged prostate with lower urinary tract symptoms: Secondary | ICD-10-CM | POA: Diagnosis not present

## 2022-11-07 DIAGNOSIS — R972 Elevated prostate specific antigen [PSA]: Secondary | ICD-10-CM | POA: Diagnosis not present

## 2022-11-12 DIAGNOSIS — X32XXXD Exposure to sunlight, subsequent encounter: Secondary | ICD-10-CM | POA: Diagnosis not present

## 2022-11-12 DIAGNOSIS — L570 Actinic keratosis: Secondary | ICD-10-CM | POA: Diagnosis not present

## 2022-11-21 DIAGNOSIS — M25642 Stiffness of left hand, not elsewhere classified: Secondary | ICD-10-CM | POA: Diagnosis not present

## 2022-11-30 DIAGNOSIS — H9312 Tinnitus, left ear: Secondary | ICD-10-CM | POA: Diagnosis not present

## 2022-11-30 DIAGNOSIS — H6122 Impacted cerumen, left ear: Secondary | ICD-10-CM | POA: Diagnosis not present

## 2022-12-04 NOTE — Progress Notes (Signed)
Initial neurology clinic note  EMON FACIANE MRN: [id number] DOB: [birth year]  Referring provider: Merri Brunette, MD  Primary care provider: Merri Brunette, MD  Reason for consult:  headaches, memory changes  Subjective:  This is Mr. Allen Pierce, a 82 y.o. male with a medical history of HTN, HLD, gout, lumbar stenosis, hx of calcium oxalate kidney stones, B12 deficiency, prior use of CPAP (likely OSA) who presents to neurology clinic with headaches and memory changes. The patient is alone today.  He had a bad headache yesterday, but had not had one for a month prior. The headache started slowly behind the left ear. It slowly travelled up to the top of the head. It is an achy and squeezing, pressure sensation. He rates the headache 7-8/10. It was bad for about 1 hour then eased up. The headache was faint when he woke up and stopped later in the evening(10-12 hours). Before a month ago, he was having maybe having a headache every 5 to 7 days. He will take tylenol as needed, which usually helps the headache. The headache yesterday, patient waited to take the tylenol. He only takes tylenol every couple of weeks. He denies changes in vision, photophobia, phonophobia, nausea, or vomiting. He denies jaw claudication or recurrent fevers. There is no significant changes in headache with position changes. He has some neck pain during HA, but otherwise not significant.  Patient also mentions memory problems. He has noticed changes over the last 1-2 years. He notices difficulty coming up with names. 60-70% of the time, the name will come to him after 1-2 minutes. He sometimes has to ask his wife the name. Every once in a while he will walk into a room and not remember what he came into it for. He denies difficulty with driving or getting lost. He is independent of all ADLs and IADLs.  Regarding sleep, patient has to get up and use the restroom (2-7 times per night, 2-3 usually). He otherwise thinks he  sleeps well. Patient does snore (per wife report). Patient tends to feel less rested when he wakes up then he used to. He does have to take a nap after lunch each day. Patient had a sleep study in the past and was on CPAP years ago. He has not used this in over 18 years. He did not feel it was benefiting him.  Regarding his mood, he feels like he is a 6-8 on depression scale (10 being the worst). He feels like he is getting old and is concerned about health and changes he is seeing.  Caffeine use: 1 cup of coffee per day; drinking more soda over the last 6 months (about 1 12 ounce bottle per day), iced tea for lunch Patient is a non-smoker EtOH use: none  Of note, patient is on B12 1000 mcg daily, B complex daily, iron, and metoprolol.  Patient was seen by Dr. Marjory Lies at [facility] for numbness and tingling in his toes in 2019. 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 Patient has ongoing symptoms with some imbalance, but has been stable.  MEDICATIONS:  Outpatient Encounter Medications as of 12/12/2022  Medication Sig   allopurinol (ZYLOPRIM) 300 MG tablet Take 1 tablet by mouth daily.   aspirin 81 MG chewable tablet 1 tablet   B-Complex TABS See admin instructions.   Cholecalciferol (VITAMIN D3) 50 MCG (2000 UT) capsule 1 capsule   clotrimazole (LOTRIMIN) 1 % cream 1 application   Cyanocobalamin (VITAMIN  B 12 PO) Take by mouth. 1000 mcg daily   furosemide (LASIX) 20 MG tablet Take 20 mg by mouth 2 (two) times daily.   gabapentin (NEURONTIN) 300 MG capsule TAKE 1 CAPSULE BY MOUTH twicea day   ibuprofen (ADVIL) 200 MG tablet Take 200 mg by mouth every 6 (six) hours as needed. Takes  400 mg prn   metoprolol (LOPRESSOR) 50 MG tablet Take 25 mg by mouth 2 (two) times daily.    Multiple Vitamins-Minerals (AIRBORNE) CHEW See admin instructions.   OVER THE COUNTER MEDICATION Vitamin d 3 25 mg daily   potassium chloride (KLOR-CON) 10 MEQ tablet Take 10 mEq by mouth every other  day.   rosuvastatin (CRESTOR) 10 MG tablet Take 20 mg by mouth every other day. At qhs   tamsulosin (FLOMAX) 0.4 MG CAPS capsule 1 capsule 30 minutes after the same meal each day Orally Once a day AN HOUR AFTER SUPPER for 90 days   traMADol (ULTRAM) 50 MG tablet    Zinc 50 MG TABS    [DISCONTINUED] famotidine (PEPCID) 20 MG tablet 1 tablet at bedtime as needed   [DISCONTINUED] hydrALAZINE (APRESOLINE) 25 MG tablet    [DISCONTINUED] naproxen sodium (ANAPROX) 220 MG tablet Take 220 mg by mouth 2 (two) times daily as needed. aleve   [DISCONTINUED] OVER THE COUNTER MEDICATION Super c/vit d/ zinc daily   [DISCONTINUED] Turmeric (QC TUMERIC COMPLEX) 500 MG CAPS Take by mouth daily.   No facility-administered encounter medications on file as of 12/12/2022.    PAST MEDICAL HISTORY: Past Medical History:  Diagnosis Date   A-fib [facility])    no cardiologist occ heard by dr pharr   AP (abdominal pain)    Back pain    bad disc   Dysrhythmia    occ followed by dr Renne Crigler   GERD (gastroesophageal reflux disease)    Hemorrhoids    History of gout    History of hiatal hernia    History of kidney stones    HLD (hyperlipidemia)    HTN (hypertension)    Inguinal hernia    Melanoma (HCC) 2017, 2021   skin cancer ears x 2, early skin cancer removed from forehead dr hall Mar 24 2020   Rash 03/2020   buttock area using cream healing    Right inguinal hernia    Sleep apnea    discontinued cpap 2011 due to no longer needed per md    PAST SURGICAL HISTORY: Past Surgical History:  Procedure Laterality Date   COLONOSCOPY     x 2 along with endoscopy each time    DIAGNOSTIC LAPAROSCOPY  2018   HERNIA REPAIR     Right   INGUINAL HERNIA REPAIR Right 08/17/2016   Procedure: LAPAROSCOPIC RIGHT INGUINAL HERNIA REPAIR;  Surgeon: Berna Bue, MD;  Location: [facility];  Service: General;  Laterality: Right;   INGUINAL HERNIA REPAIR Right 01/25/2017   Procedure: RIGHT INGUINAL HERNIA REPAIR WITH MESH;   Surgeon: Berna Bue, MD;  Location: [facility];  Service: General;  Laterality: Right;   INGUINAL HERNIA REPAIR Left 04/13/2020   Procedure: OPEN LEFT INGUINAL HERNIA REPAIR WITH MESH;  Surgeon: Berna Bue, MD;  Location: [facility];  Service: General;  Laterality: Left;   INSERTION OF MESH Right 01/25/2017   Procedure: INSERTION OF MESH;  Surgeon: Berna Bue, MD;  Location: [facility];  Service: General;  Laterality: Right;   LAPAROSCOPIC REMOVAL OF MESENTERIC MASS N/A 08/17/2016   Procedure: BIOPSY  OF MESENTERIC MASS;  Surgeon: Berna Bue, MD;  Location: [facility];  Service: General;  Laterality: N/A;   ORIF WRIST FRACTURE Left 06/12/2022   Procedure: OPEN REDUCTION INTERNAL FIXATION (ORIF) WRIST FRACTURE;  Surgeon: Gomez Cleverly, MD;  Location: [facility];  Service: Orthopedics;  Laterality: Left;  regional with MAC    ALLERGIES: Allergies  Allergen Reactions   Lisinopril Cough    FAMILY HISTORY: Family History  Problem Relation Age of Onset   Hypertension Mother    CVA Father    Hypertension Father    Heart attack Father    Diabetes Brother    Hypertension Brother    Colon polyps Brother    Melanoma Brother    Stroke Brother    Cancer Brother        kidney    SOCIAL HISTORY: Social History   Tobacco Use   Smoking status: Former    Types: Cigarettes   Smokeless tobacco: Never   Tobacco comments:    1-2 years in high school  Vaping Use   Vaping Use: Never used  Substance Use Topics   Alcohol use: Never   Drug use: Never   Social History   Social History Narrative   Lives with wife   Caffeine- coffee 1 cup daily    Objective:  Vital Signs:  BP (!) 167/77   Pulse (!) 46   Ht 6\' 1"  (1.854 m)   Wt 202 lb 9.6 oz (91.9 kg)   SpO2 98%   BMI 26.73 kg/m   General: No acute distress.  Patient appears well-groomed.   Head:  Normocephalic/atraumatic Neck: supple, no paraspinal tenderness, full range of motion Back: No  paraspinal tenderness Lungs: Non-labored breathing on room air   Neurological Exam: Mental status: alert and oriented, speech fluent and not dysarthric, language intact.  MoCA: 21/30 (missed points for line, copying cube, words with F (only 10), delayed recall 0/5, exact date) - see scanned document  Cranial nerves: CN I: not tested CN II: pupils equal, round and reactive to light, visual fields intact CN III, IV, VI:  full range of motion, no nystagmus, no ptosis CN V: facial sensation intact. CN VII: upper and lower face symmetric CN VIII: hearing intact CN IX, X: uvula midline CN XI: sternocleidomastoid and trapezius muscles intact CN XII: tongue midline  Bulk & Tone: normal, no fasciculations. Motor:  muscle strength 5/5 throughout Deep Tendon Reflexes:  2+ throughout,  toes downgoing.   Sensation:  Pinprick diminished in bilateral feet Finger to nose testing:  Without dysmetria.   Gait:  Mildly wide based gait.  Romberg with mild sway.   Labs and Imaging review: Internal labs: Lab Results  Component Value Date   HGBA1C 6.0 (H) 03/25/2018   Lab Results  Component Value Date   VITAMINB12 182 (L) 03/25/2018   Lab Results  Component Value Date   TSH 1.580 03/25/2018   BMP (06/03/22): significant for glucose of 114, Cr 1.90  MM panel (03/25/18): no M protein  External labs: TSH (02/26/22): 1.89 B12 (02/26/22): 646  Imaging: CT head and cervical spine wo contrast (06/03/22): FINDINGS: CT HEAD FINDINGS   Brain: No evidence of acute infarction, hemorrhage, hydrocephalus, extra-axial collection or mass lesion/mass effect. Chronic white matter ischemic changes are noted.   Vascular: No hyperdense vessel or unexpected calcification.   Skull: Normal. Negative for fracture or focal lesion.   Sinuses/Orbits: No acute finding.   Other: None.   CT CERVICAL SPINE FINDINGS  Alignment: Within normal limits.   Skull base and vertebrae: 7 cervical segments are  well visualized. Vertebral body height is well maintained. Multilevel osteophytic change and facet hypertrophic changes are noted. No acute fracture or acute facet abnormality is noted. The odontoid is normal limits.   Soft tissues and spinal canal: Surrounding soft tissue structures demonstrate diffuse vascular calcifications. There is a 2.1 cm hypodense nodule arising from the lower portion of the right lobe of thyroid. Some heterogeneity of the left lobe of the thyroid is noted as well.   Upper chest: Visualized lung apices are within normal limits.   Other: None   IMPRESSION: CT of the head: Chronic white matter ischemic changes. No acute abnormality noted.   CT of the cervical spine: Multilevel degenerative changes without acute fracture.  Assessment/Plan:  Allen Pierce is a 82 y.o. male who presents for evaluation of headache and memory difficulties. He has a relevant medical history of HTN, HLD, gout, lumbar stenosis, hx of calcium oxalate kidney stones, B12 deficiency, prior use of CPAP (likely OSA). His neurological examination is pertinent for diminished sensation in bilateral lower extremities with wide based gait. His cognitive testing today with MoCA was abnormal with a score of 21/30 (see full details above). While the MoCA is abnormal, there is no clear evidence of dementia. There may be some mild cognitive impairment, but more likely are contributions from his mood and poor sleep, likely untreated OSA. His headaches also seem to begin in the morning and could be secondary to OSA as well. I will work up as below   PLAN: -Blood work: TSH, B12 -Sleep study and sleep medicine referral -Will monitor mood -Will discuss urinary frequency at night with PCP to help keep night time awakenings to a minimum if able  -Return to clinic 6 months  The impression above as well as the plan as outlined below were extensively discussed with the patient who voiced understanding. All  questions were answered to their satisfaction.  When available, results of the above investigations and possible further recommendations will be communicated to the patient via telephone/MyChart. Patient to call office if not contacted after expected testing turnaround time.   Total time spent reviewing records, interview, history/exam, documentation, and coordination of care on day of encounter:  65 min   Thank you for allowing me to participate in patient's care.  If I can answer any additional questions, I would be pleased to do so.  Jacquelyne Balint, MD   CC: Merri Brunette, MD [address] [place] [address]  CC: Referring provider: Merri Brunette, MD [address] [place],  [place] [address]

## 2022-12-12 ENCOUNTER — Other Ambulatory Visit (INDEPENDENT_AMBULATORY_CARE_PROVIDER_SITE_OTHER): Payer: PPO

## 2022-12-12 ENCOUNTER — Ambulatory Visit (INDEPENDENT_AMBULATORY_CARE_PROVIDER_SITE_OTHER): Payer: PPO | Admitting: Neurology

## 2022-12-12 ENCOUNTER — Encounter: Payer: Self-pay | Admitting: Neurology

## 2022-12-12 VITALS — BP 140/70 | HR 46 | Ht 73.0 in | Wt 202.6 lb

## 2022-12-12 DIAGNOSIS — R400 Somnolence: Secondary | ICD-10-CM

## 2022-12-12 DIAGNOSIS — G44219 Episodic tension-type headache, not intractable: Secondary | ICD-10-CM

## 2022-12-12 DIAGNOSIS — G4733 Obstructive sleep apnea (adult) (pediatric): Secondary | ICD-10-CM

## 2022-12-12 DIAGNOSIS — R413 Other amnesia: Secondary | ICD-10-CM

## 2022-12-12 DIAGNOSIS — R0683 Snoring: Secondary | ICD-10-CM | POA: Diagnosis not present

## 2022-12-12 NOTE — Patient Instructions (Addendum)
I saw you today for headaches and memory changes. As we discussed, both symptoms may be the result of poor sleep, likely from untreated sleep apnea.  I would like to investigate further with the following: -Blood work today -Sleep study and sleep medicine referral  Discuss the urinary frequency at night with your primary care doctor as helping you get up less during the night will also help your sleep.  I will be in touch when I have your results. Assuming you are treated for sleep apnea, I would expect your symptoms to improve (memory and likely headache).  I will see you back in clinic in 6 months to see how you are doing. If you are having worsening symptoms or questions or concerns, please call me sooner and I can discuss or bring you into clinic sooner.  The physicians and staff at [facility] are committed to providing excellent care. You may receive a survey requesting feedback about your experience at our office. We strive to receive "very good" responses to the survey questions. If you feel that your experience would prevent you from giving the office a "very good " response, please contact our office to try to remedy the situation. We may be reached at [phone]. Thank you for taking the time out of your busy day to complete the survey.  Jacquelyne Balint, MD [facility]

## 2022-12-13 LAB — TSH: TSH: 1.37 u[IU]/mL (ref 0.35–5.50)

## 2022-12-13 LAB — VITAMIN B12: Vitamin B-12: 625 pg/mL (ref 211–911)

## 2023-01-01 DIAGNOSIS — H9312 Tinnitus, left ear: Secondary | ICD-10-CM | POA: Diagnosis not present

## 2023-01-01 DIAGNOSIS — H903 Sensorineural hearing loss, bilateral: Secondary | ICD-10-CM | POA: Diagnosis not present

## 2023-01-07 DIAGNOSIS — M25522 Pain in left elbow: Secondary | ICD-10-CM | POA: Diagnosis not present

## 2023-01-07 DIAGNOSIS — M25422 Effusion, left elbow: Secondary | ICD-10-CM | POA: Diagnosis not present

## 2023-01-07 DIAGNOSIS — M19022 Primary osteoarthritis, left elbow: Secondary | ICD-10-CM | POA: Diagnosis not present

## 2023-02-06 DIAGNOSIS — Z125 Encounter for screening for malignant neoplasm of prostate: Secondary | ICD-10-CM | POA: Diagnosis not present

## 2023-02-06 DIAGNOSIS — I10 Essential (primary) hypertension: Secondary | ICD-10-CM | POA: Diagnosis not present

## 2023-02-06 DIAGNOSIS — E041 Nontoxic single thyroid nodule: Secondary | ICD-10-CM | POA: Diagnosis not present

## 2023-02-06 DIAGNOSIS — R7303 Prediabetes: Secondary | ICD-10-CM | POA: Diagnosis not present

## 2023-02-11 DIAGNOSIS — E875 Hyperkalemia: Secondary | ICD-10-CM | POA: Diagnosis not present

## 2023-02-11 DIAGNOSIS — Z0000 Encounter for general adult medical examination without abnormal findings: Secondary | ICD-10-CM | POA: Diagnosis not present

## 2023-02-11 DIAGNOSIS — E538 Deficiency of other specified B group vitamins: Secondary | ICD-10-CM | POA: Diagnosis not present

## 2023-02-11 DIAGNOSIS — I10 Essential (primary) hypertension: Secondary | ICD-10-CM | POA: Diagnosis not present

## 2023-02-11 DIAGNOSIS — I2510 Atherosclerotic heart disease of native coronary artery without angina pectoris: Secondary | ICD-10-CM | POA: Diagnosis not present

## 2023-02-11 DIAGNOSIS — M4807 Spinal stenosis, lumbosacral region: Secondary | ICD-10-CM | POA: Diagnosis not present

## 2023-02-11 DIAGNOSIS — I872 Venous insufficiency (chronic) (peripheral): Secondary | ICD-10-CM | POA: Diagnosis not present

## 2023-02-11 DIAGNOSIS — K219 Gastro-esophageal reflux disease without esophagitis: Secondary | ICD-10-CM | POA: Diagnosis not present

## 2023-02-11 DIAGNOSIS — G629 Polyneuropathy, unspecified: Secondary | ICD-10-CM | POA: Diagnosis not present

## 2023-02-11 DIAGNOSIS — M109 Gout, unspecified: Secondary | ICD-10-CM | POA: Diagnosis not present

## 2023-02-11 DIAGNOSIS — D890 Polyclonal hypergammaglobulinemia: Secondary | ICD-10-CM | POA: Diagnosis not present

## 2023-02-11 DIAGNOSIS — Z23 Encounter for immunization: Secondary | ICD-10-CM | POA: Diagnosis not present

## 2023-03-11 DIAGNOSIS — D890 Polyclonal hypergammaglobulinemia: Secondary | ICD-10-CM | POA: Diagnosis not present

## 2023-03-11 DIAGNOSIS — E875 Hyperkalemia: Secondary | ICD-10-CM | POA: Diagnosis not present

## 2023-03-11 DIAGNOSIS — D692 Other nonthrombocytopenic purpura: Secondary | ICD-10-CM | POA: Diagnosis not present

## 2023-03-12 DIAGNOSIS — N401 Enlarged prostate with lower urinary tract symptoms: Secondary | ICD-10-CM | POA: Diagnosis not present

## 2023-03-12 DIAGNOSIS — D890 Polyclonal hypergammaglobulinemia: Secondary | ICD-10-CM | POA: Diagnosis not present

## 2023-03-12 DIAGNOSIS — I10 Essential (primary) hypertension: Secondary | ICD-10-CM | POA: Diagnosis not present

## 2023-04-18 DIAGNOSIS — L570 Actinic keratosis: Secondary | ICD-10-CM | POA: Diagnosis not present

## 2023-04-18 DIAGNOSIS — X32XXXD Exposure to sunlight, subsequent encounter: Secondary | ICD-10-CM | POA: Diagnosis not present

## 2023-04-24 ENCOUNTER — Ambulatory Visit: Payer: PPO | Admitting: Internal Medicine

## 2023-04-30 ENCOUNTER — Ambulatory Visit: Payer: PPO | Admitting: Internal Medicine

## 2023-06-07 NOTE — Progress Notes (Deleted)
 NEUROLOGY FOLLOW UP OFFICE NOTE  Allen Pierce [id number]  Subjective:  Allen Pierce is a 83 y.o. year old male with a medical history of HTN, HLD, gout, lumbar stenosis, hx of calcium  oxalate kidney stones, B12 deficiency, prior use of CPAP (likely OSA) who we last saw on 12/12/22 for headaches and memory changes.  To briefly review: He had a bad headache yesterday, but had not had one for a month prior. The headache started slowly behind the left ear. It slowly travelled up to the top of the head. It is an achy and squeezing, pressure sensation. He rates the headache 7-8/10. It was bad for about 1 hour then eased up. The headache was faint when he woke up and stopped later in the evening(10-12 hours). Before a month ago, he was having maybe having a headache every 5 to 7 days. He will take tylenol  as needed, which usually helps the headache. The headache yesterday, patient waited to take the tylenol . He only takes tylenol  every couple of weeks. He denies changes in vision, photophobia, phonophobia, nausea, or vomiting. He denies jaw claudication or recurrent fevers. There is no significant changes in headache with position changes. He has some neck pain during HA, but otherwise not significant.   Patient also mentions memory problems. He has noticed changes over the last 1-2 years. He notices difficulty coming up with names. 60-70% of the time, the name will come to him after 1-2 minutes. He sometimes has to ask his wife the name. Every once in a while he will walk into a room and not remember what he came into it for. He denies difficulty with driving or getting lost. He is independent of all ADLs and IADLs.   Regarding sleep, patient has to get up and use the restroom (2-7 times per night, 2-3 usually). He otherwise thinks he sleeps well. Patient does snore (per wife report). Patient tends to feel less rested when he wakes up then he used to. He does have to take a nap after lunch each day.  Patient had a sleep study in the past and was on CPAP years ago. He has not used this in over 18 years. He did not feel it was benefiting him.   Regarding his mood, he feels like he is a 6-8 on depression scale (10 being the worst). He feels like he is getting old and is concerned about health and changes he is seeing.   Caffeine use: 1 cup of coffee per day; drinking more soda over the last 6 months (about 1 12 ounce bottle per day), iced tea for lunch Patient is a non-smoker EtOH use: none   Of note, patient is on B12 1000 mcg daily, B complex daily, iron, and metoprolol .   Patient was seen by Dr. Margaret at [facility] for numbness and tingling in his toes in 2019. 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 Patient has ongoing symptoms with some imbalance, but has been stable.  Most recent Assessment and Plan (12/12/22): Allen Pierce is a 83 y.o. male who presents for evaluation of headache and memory difficulties. He has a relevant medical history of HTN, HLD, gout, lumbar stenosis, hx of calcium  oxalate kidney stones, B12 deficiency, prior use of CPAP (likely OSA). His neurological examination is pertinent for diminished sensation in bilateral lower extremities with wide based gait. His cognitive testing today with MoCA was abnormal with a score of 21/30 (see full details above). While  the MoCA is abnormal, there is no clear evidence of dementia. There may be some mild cognitive impairment, but more likely are contributions from his mood and poor sleep, likely untreated OSA. His headaches also seem to begin in the morning and could be secondary to OSA as well. I will work up as below    PLAN: -Blood work: TSH, B12 -Sleep study and sleep medicine referral -Will monitor mood -Will discuss urinary frequency at night with PCP to help keep night time awakenings to a minimum if able  Since their last visit: TSH and B12 were normal. Patient did not schedule sleep study or  sleep medicine referral.***  Sleep?*** Mood?***  MEDICATIONS:  Outpatient Encounter Medications as of 06/14/2023  Medication Sig   allopurinol  (ZYLOPRIM ) 300 MG tablet Take 1 tablet by mouth daily.   aspirin 81 MG chewable tablet 1 tablet   B-Complex TABS See admin instructions.   Cholecalciferol (VITAMIN D3) 50 MCG (2000 UT) capsule 1 capsule   clotrimazole (LOTRIMIN) 1 % cream 1 application   Cyanocobalamin  (VITAMIN B 12 PO) Take by mouth. 1000 mcg daily   furosemide (LASIX) 20 MG tablet Take 20 mg by mouth 2 (two) times daily.   gabapentin  (NEURONTIN ) 300 MG capsule TAKE 1 CAPSULE BY MOUTH twicea day   ibuprofen (ADVIL) 200 MG tablet Take 200 mg by mouth every 6 (six) hours as needed. Takes  400 mg prn   metoprolol  (LOPRESSOR ) 50 MG tablet Take 25 mg by mouth 2 (two) times daily.    Multiple Vitamins-Minerals (AIRBORNE) CHEW See admin instructions.   OVER THE COUNTER MEDICATION Vitamin d 3 25 mg daily   potassium chloride (KLOR-CON) 10 MEQ tablet Take 10 mEq by mouth every other day.   rosuvastatin  (CRESTOR ) 10 MG tablet Take 20 mg by mouth every other day. At qhs   tamsulosin (FLOMAX) 0.4 MG CAPS capsule 1 capsule 30 minutes after the same meal each day Orally Once a day AN HOUR AFTER SUPPER for 90 days   traMADol (ULTRAM) 50 MG tablet    Zinc  50 MG TABS    No facility-administered encounter medications on file as of 06/14/2023.    PAST MEDICAL HISTORY: Past Medical History:  Diagnosis Date   A-fib [facility])    no cardiologist occ heard by dr pharr   AP (abdominal pain)    Back pain    bad disc   Dysrhythmia    occ followed by dr clarice   GERD (gastroesophageal reflux disease)    Hemorrhoids    History of gout    History of hiatal hernia    History of kidney stones    HLD (hyperlipidemia)    HTN (hypertension)    Inguinal hernia    Melanoma (HCC) 2017, 2021   skin cancer ears x 2, early skin cancer removed from forehead dr hall Mar 24 2020   Rash 03/2020   buttock area  using cream healing    Right inguinal hernia    Sleep apnea    discontinued cpap 2011 due to no longer needed per md    PAST SURGICAL HISTORY: Past Surgical History:  Procedure Laterality Date   COLONOSCOPY     x 2 along with endoscopy each time    DIAGNOSTIC LAPAROSCOPY  2018   HERNIA REPAIR     Right   INGUINAL HERNIA REPAIR Right 08/17/2016   Procedure: LAPAROSCOPIC RIGHT INGUINAL HERNIA REPAIR;  Surgeon: Mitzie DELENA Freund, MD;  Location: [facility];  Service: General;  Laterality:  Right;   INGUINAL HERNIA REPAIR Right 01/25/2017   Procedure: RIGHT INGUINAL HERNIA REPAIR WITH MESH;  Surgeon: Signe Mitzie LABOR, MD;  Location: [facility];  Service: General;  Laterality: Right;   INGUINAL HERNIA REPAIR Left 04/13/2020   Procedure: OPEN LEFT INGUINAL HERNIA REPAIR WITH MESH;  Surgeon: Signe Mitzie LABOR, MD;  Location: [facility];  Service: General;  Laterality: Left;   INSERTION OF MESH Right 01/25/2017   Procedure: INSERTION OF MESH;  Surgeon: Signe Mitzie LABOR, MD;  Location: [facility];  Service: General;  Laterality: Right;   LAPAROSCOPIC REMOVAL OF MESENTERIC MASS N/A 08/17/2016   Procedure: BIOPSY OF MESENTERIC MASS;  Surgeon: Mitzie LABOR Signe, MD;  Location: [facility];  Service: General;  Laterality: N/A;   ORIF WRIST FRACTURE Left 06/12/2022   Procedure: OPEN REDUCTION INTERNAL FIXATION (ORIF) WRIST FRACTURE;  Surgeon: Alyse Agent, MD;  Location: [facility];  Service: Orthopedics;  Laterality: Left;  regional with MAC    ALLERGIES: Allergies  Allergen Reactions   Lisinopril Cough    FAMILY HISTORY: Family History  Problem Relation Age of Onset   Hypertension Mother    CVA Father    Hypertension Father    Heart attack Father    Diabetes Brother    Hypertension Brother    Colon polyps Brother    Melanoma Brother    Stroke Brother    Cancer Brother        kidney    SOCIAL HISTORY: Social History   Tobacco Use   Smoking status: Former    Types:  Cigarettes   Smokeless tobacco: Never   Tobacco comments:    1-2 years in high school  Vaping Use   Vaping status: Never Used  Substance Use Topics   Alcohol use: Never   Drug use: Never   Social History   Social History Narrative   Lives with wife   Caffeine- coffee 1 cup daily      Objective:  Vital Signs:  There were no vitals taken for this visit.  ***  Labs and Imaging review: New results: 12/12/22: B12: 625 TSH wnl  Previously reviewed results: Lab Results  Component Value Date    HGBA1C 6.0 (H) 03/25/2018      Recent Labs       Lab Results  Component Value Date    VITAMINB12 182 (L) 03/25/2018      Recent Labs[] Expand by Default       Lab Results  Component Value Date    TSH 1.580 03/25/2018      BMP (06/03/22): significant for glucose of 114, Cr 1.90   MM panel (03/25/18): no M protein   External labs: TSH (02/26/22): 1.89 B12 (02/26/22): 646   Imaging: CT head and cervical spine wo contrast (06/03/22): FINDINGS: CT HEAD FINDINGS   Brain: No evidence of acute infarction, hemorrhage, hydrocephalus, extra-axial collection or mass lesion/mass effect. Chronic white matter ischemic changes are noted.   Vascular: No hyperdense vessel or unexpected calcification.   Skull: Normal. Negative for fracture or focal lesion.   Sinuses/Orbits: No acute finding.   Other: None.   CT CERVICAL SPINE FINDINGS   Alignment: Within normal limits.   Skull base and vertebrae: 7 cervical segments are well visualized. Vertebral body height is well maintained. Multilevel osteophytic change and facet hypertrophic changes are noted. No acute fracture or acute facet abnormality is noted. The odontoid is normal limits.   Soft tissues and spinal canal: Surrounding soft tissue structures demonstrate  diffuse vascular calcifications. There is a 2.1 cm hypodense nodule arising from the lower portion of the right lobe of thyroid . Some heterogeneity of the left  lobe of the thyroid  is noted as well.   Upper chest: Visualized lung apices are within normal limits.   Other: None   IMPRESSION: CT of the head: Chronic white matter ischemic changes. No acute abnormality noted.   CT of the cervical spine: Multilevel degenerative changes without acute fracture.  Assessment/Plan:  This is Allen Pierce, a 83 y.o. male with: ***   Plan: ***  Return to clinic in ***  Total time spent reviewing records, interview, history/exam, documentation, and coordination of care on day of encounter:  *** min  Venetia Potters, MD

## 2023-06-14 ENCOUNTER — Ambulatory Visit: Payer: PPO | Admitting: Neurology

## 2023-06-21 NOTE — Progress Notes (Signed)
NEUROLOGY FOLLOW UP OFFICE NOTE  Allen Pierce [id number]  Subjective:  Allen Pierce is a 83 y.o. year old male with a medical history of HTN, HLD, gout, lumbar stenosis, hx of calcium oxalate kidney stones, B12 deficiency, prior use of CPAP (likely OSA) who we last saw on 12/12/22 for headaches and memory changes.  To briefly review: He had a bad headache yesterday, but had not had one for a month prior. The headache started slowly behind the left ear. It slowly travelled up to the top of the head. It is an achy and squeezing, pressure sensation. He rates the headache 7-8/10. It was bad for about 1 hour then eased up. The headache was faint when he woke up and stopped later in the evening(10-12 hours). Before a month ago, he was having maybe having a headache every 5 to 7 days. He will take tylenol as needed, which usually helps the headache. The headache yesterday, patient waited to take the tylenol. He only takes tylenol every couple of weeks. He denies changes in vision, photophobia, phonophobia, nausea, or vomiting. He denies jaw claudication or recurrent fevers. There is no significant changes in headache with position changes. He has some neck pain during HA, but otherwise not significant.   Patient also mentions memory problems. He has noticed changes over the last 1-2 years. He notices difficulty coming up with names. 60-70% of the time, the name will come to him after 1-2 minutes. He sometimes has to ask his wife the name. Every once in a while he will walk into a room and not remember what he came into it for. He denies difficulty with driving or getting lost. He is independent of all ADLs and IADLs.   Regarding sleep, patient has to get up and use the restroom (2-7 times per night, 2-3 usually). He otherwise thinks he sleeps well. Patient does snore (per wife report). Patient tends to feel less rested when he wakes up then he used to. He does have to take a nap after lunch each day.  Patient had a sleep study in the past and was on CPAP years ago. He has not used this in over 18 years. He did not feel it was benefiting him.   Regarding his mood, he feels like he is a 6-8 on depression scale (10 being the worst). He feels like he is getting old and is concerned about health and changes he is seeing.   Caffeine use: 1 cup of coffee per day; drinking more soda over the last 6 months (about 1 12 ounce bottle per day), iced tea for lunch Patient is a non-smoker EtOH use: none   Of note, patient is on B12 1000 mcg daily, B complex daily, iron, and metoprolol.   Patient was seen by Dr. Marjory Lies at [facility] for numbness and tingling in his toes in 2019. 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 Patient has ongoing symptoms with some imbalance, but has been stable.  Most recent Assessment and Plan (12/12/22): Allen Pierce is a 83 y.o. male who presents for evaluation of headache and memory difficulties. He has a relevant medical history of HTN, HLD, gout, lumbar stenosis, hx of calcium oxalate kidney stones, B12 deficiency, prior use of CPAP (likely OSA). His neurological examination is pertinent for diminished sensation in bilateral lower extremities with wide based gait. His cognitive testing today with MoCA was abnormal with a score of 21/30 (see full details above). While  the MoCA is abnormal, there is no clear evidence of dementia. There may be some mild cognitive impairment, but more likely are contributions from his mood and poor sleep, likely untreated OSA. His headaches also seem to begin in the morning and could be secondary to OSA as well. I will work up as below    PLAN: -Blood work: TSH, B12 -Sleep study and sleep medicine referral -Will monitor mood -Will discuss urinary frequency at night with PCP to help keep night time awakenings to a minimum if able  Since their last visit: TSH and B12 were normal. Patient did not schedule sleep study or  sleep medicine referral. Patient did not pursue this.   Patient thinks his symptoms are about the same. His headaches has not been bad. He may get 1 headache per week. They occur either at bedtime or in the morning. His memory may be a little worse. He tends to forget names. He has no difficulty taking care of himself and is still driving and not get lost.  He thinks his sleep is okay. He has to wake 3-6 times a night to use the restroom. He thinks he sleeps 8 hours, but it is broken up. He does feel well rested, but feels tired in the afternoon and will nap if he can.  He denies any recent mood issues, not down or depressed.   MEDICATIONS:  Outpatient Encounter Medications as of 07/03/2023  Medication Sig   allopurinol (ZYLOPRIM) 300 MG tablet Take 1 tablet by mouth daily.   aspirin 81 MG chewable tablet 1 tablet   B-Complex TABS See admin instructions.   Cholecalciferol (VITAMIN D3) 50 MCG (2000 UT) capsule 1 capsule   clotrimazole (LOTRIMIN) 1 % cream 1 application   Cyanocobalamin (VITAMIN B 12 PO) Take by mouth. 1000 mcg daily   furosemide (LASIX) 20 MG tablet Take 20 mg by mouth 2 (two) times daily.   gabapentin (NEURONTIN) 300 MG capsule TAKE 1 CAPSULE BY MOUTH twicea day   ibuprofen (ADVIL) 200 MG tablet Take 200 mg by mouth every 6 (six) hours as needed. Takes  400 mg prn   metoprolol (LOPRESSOR) 50 MG tablet Take 25 mg by mouth 2 (two) times daily.    Multiple Vitamins-Minerals (AIRBORNE) CHEW See admin instructions.   OVER THE COUNTER MEDICATION Vitamin d 3 25 mg daily   potassium chloride (KLOR-CON) 10 MEQ tablet Take 10 mEq by mouth every other day.   rosuvastatin (CRESTOR) 10 MG tablet Take 20 mg by mouth every other day. At qhs   tamsulosin (FLOMAX) 0.4 MG CAPS capsule 1 capsule 30 minutes after the same meal each day Orally Once a day AN HOUR AFTER SUPPER for 90 days   traMADol (ULTRAM) 50 MG tablet Take 50 mg by mouth every 6 (six) hours as needed.   Zinc 50 MG TABS   (Patient not taking: Reported on 07/03/2023)   No facility-administered encounter medications on file as of 07/03/2023.    PAST MEDICAL HISTORY: Past Medical History:  Diagnosis Date   A-fib [facility])    no cardiologist occ heard by dr pharr   AP (abdominal pain)    Back pain    bad disc   Dysrhythmia    occ followed by dr Renne Crigler   GERD (gastroesophageal reflux disease)    Hemorrhoids    History of gout    History of hiatal hernia    History of kidney stones    HLD (hyperlipidemia)    HTN (hypertension)  Inguinal hernia    Melanoma (HCC) 2017, 2021   skin cancer ears x 2, early skin cancer removed from forehead dr hall Mar 24 2020   Rash 03/2020   buttock area using cream healing    Right inguinal hernia    Sleep apnea    discontinued cpap 2011 due to no longer needed per md    PAST SURGICAL HISTORY: Past Surgical History:  Procedure Laterality Date   COLONOSCOPY     x 2 along with endoscopy each time    DIAGNOSTIC LAPAROSCOPY  2018   HERNIA REPAIR     Right   INGUINAL HERNIA REPAIR Right 08/17/2016   Procedure: LAPAROSCOPIC RIGHT INGUINAL HERNIA REPAIR;  Surgeon: Berna Bue, MD;  Location: [facility];  Service: General;  Laterality: Right;   INGUINAL HERNIA REPAIR Right 01/25/2017   Procedure: RIGHT INGUINAL HERNIA REPAIR WITH MESH;  Surgeon: Berna Bue, MD;  Location: [facility];  Service: General;  Laterality: Right;   INGUINAL HERNIA REPAIR Left 04/13/2020   Procedure: OPEN LEFT INGUINAL HERNIA REPAIR WITH MESH;  Surgeon: Berna Bue, MD;  Location: [facility];  Service: General;  Laterality: Left;   INSERTION OF MESH Right 01/25/2017   Procedure: INSERTION OF MESH;  Surgeon: Berna Bue, MD;  Location: [facility];  Service: General;  Laterality: Right;   LAPAROSCOPIC REMOVAL OF MESENTERIC MASS N/A 08/17/2016   Procedure: BIOPSY OF MESENTERIC MASS;  Surgeon: Berna Bue, MD;  Location: [facility];  Service: General;  Laterality: N/A;   ORIF  WRIST FRACTURE Left 06/12/2022   Procedure: OPEN REDUCTION INTERNAL FIXATION (ORIF) WRIST FRACTURE;  Surgeon: Gomez Cleverly, MD;  Location: [facility];  Service: Orthopedics;  Laterality: Left;  regional with MAC    ALLERGIES: Allergies  Allergen Reactions   Lisinopril Cough    FAMILY HISTORY: Family History  Problem Relation Age of Onset   Hypertension Mother    CVA Father    Hypertension Father    Heart attack Father    Diabetes Brother    Hypertension Brother    Colon polyps Brother    Melanoma Brother    Stroke Brother    Cancer Brother        kidney    SOCIAL HISTORY: Social History   Tobacco Use   Smoking status: Former    Types: Cigarettes   Smokeless tobacco: Never   Tobacco comments:    1-2 years in high school  Vaping Use   Vaping status: Never Used  Substance Use Topics   Alcohol use: Never   Drug use: Never   Social History   Social History Narrative   Lives with wife   Caffeine- coffee 1 cup daily   Are you right handed or left handed? left   Are you currently employed ?    What is your current occupation?retired   Do you live at home alone?   Who lives with you? wife   What type of home do you live in: 1 story or 2 story? one          Objective:  Vital Signs:  BP (!) 165/68 (BP Location: Left Arm)   Pulse (!) 53   Ht 6\' 1"  (1.854 m)   Wt 200 lb (90.7 kg)   SpO2 98%   BMI 26.39 kg/m   General: No acute distress.  Patient appears well-groomed.   Head:  Normocephalic/atraumatic Neck: supple Lungs: Non-labored breathing on room air   Neurological  Exam: Mental status: alert and oriented, speech fluent and not dysarthric, language intact.  Cranial nerves: CN I: not tested CN II: pupils equal, round and reactive to light, visual fields intact CN III, IV, VI:  full range of motion, no nystagmus, no ptosis CN V: facial sensation intact. CN VII: upper and lower face symmetric CN VIII: hearing intact CN IX, X: uvula  midline CN XI: sternocleidomastoid and trapezius muscles intact CN XII: tongue midline  Bulk & Tone: normal, no fasciculations. Motor:  muscle strength 5/5 throughout Deep Tendon Reflexes:  2+ throughout, except absent at bilateral ankles.   Sensation:  Pinprick intact, vibration diminished to bilateral patella Finger to nose testing:  Without dysmetria.    Gait:  Narrow based, mildly unsteady, slow gait.   Labs and Imaging review: New results: 12/12/22: B12: 625 TSH wnl  Previously reviewed results: Lab Results  Component Value Date    HGBA1C 6.0 (H) 03/25/2018      Recent Labs       Lab Results  Component Value Date    VITAMINB12 182 (L) 03/25/2018      Recent Labs[] Expand by Default       Lab Results  Component Value Date    TSH 1.580 03/25/2018      BMP (06/03/22): significant for glucose of 114, Cr 1.90   MM panel (03/25/18): no M protein   External labs: TSH (02/26/22): 1.89 B12 (02/26/22): 646   Imaging: CT head and cervical spine wo contrast (06/03/22): FINDINGS: CT HEAD FINDINGS   Brain: No evidence of acute infarction, hemorrhage, hydrocephalus, extra-axial collection or mass lesion/mass effect. Chronic white matter ischemic changes are noted.   Vascular: No hyperdense vessel or unexpected calcification.   Skull: Normal. Negative for fracture or focal lesion.   Sinuses/Orbits: No acute finding.   Other: None.   CT CERVICAL SPINE FINDINGS   Alignment: Within normal limits.   Skull base and vertebrae: 7 cervical segments are well visualized. Vertebral body height is well maintained. Multilevel osteophytic change and facet hypertrophic changes are noted. No acute fracture or acute facet abnormality is noted. The odontoid is normal limits.   Soft tissues and spinal canal: Surrounding soft tissue structures demonstrate diffuse vascular calcifications. There is a 2.1 cm hypodense nodule arising from the lower portion of the right lobe  of thyroid. Some heterogeneity of the left lobe of the thyroid is noted as well.   Upper chest: Visualized lung apices are within normal limits.   Other: None   IMPRESSION: CT of the head: Chronic white matter ischemic changes. No acute abnormality noted.   CT of the cervical spine: Multilevel degenerative changes without acute fracture.  Assessment/Plan:  This is Allen Pierce, a 83 y.o. male with: Cognitive change, particularly with memory. Independent of ADLs, no red flag symptoms. CTH in 2023 showed mild atrophy, nothing significantly abnormal for age. TSH and B12 were normal. MoCA on 12/12/22 was 21/30. He may have some mild cognitive impairment, but no obvious signs of dementia. Symptoms could also be related to poor sleep and untreated OSA. He has not seen sleep medicine as we discussed at last visit. He is agreeable to seeing sleep medicine now. 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 I will check labs for other treatable causes. Headaches - improved, now only minor, once a week  Plan: -Blood work: B1, HbA1c, IFE -Sleep medicine referral -Fall precautions discussed  Return to clinic in 6 months   Jacquelyne Balint, MD

## 2023-07-03 ENCOUNTER — Encounter: Payer: Self-pay | Admitting: Neurology

## 2023-07-03 ENCOUNTER — Ambulatory Visit: Payer: PPO | Admitting: Neurology

## 2023-07-03 ENCOUNTER — Other Ambulatory Visit: Payer: PPO

## 2023-07-03 VITALS — BP 165/68 | HR 53 | Ht 73.0 in | Wt 200.0 lb

## 2023-07-03 DIAGNOSIS — R400 Somnolence: Secondary | ICD-10-CM | POA: Diagnosis not present

## 2023-07-03 DIAGNOSIS — R7303 Prediabetes: Secondary | ICD-10-CM | POA: Diagnosis not present

## 2023-07-03 DIAGNOSIS — R0683 Snoring: Secondary | ICD-10-CM

## 2023-07-03 DIAGNOSIS — G44219 Episodic tension-type headache, not intractable: Secondary | ICD-10-CM

## 2023-07-03 DIAGNOSIS — G4733 Obstructive sleep apnea (adult) (pediatric): Secondary | ICD-10-CM | POA: Diagnosis not present

## 2023-07-03 DIAGNOSIS — R413 Other amnesia: Secondary | ICD-10-CM | POA: Diagnosis not present

## 2023-07-03 DIAGNOSIS — G629 Polyneuropathy, unspecified: Secondary | ICD-10-CM

## 2023-07-03 NOTE — Patient Instructions (Signed)
I would like to do some blood work today to evaluate for treatable causes of neuropathy.  In terms of memory, I am still concerned this could be related to sleep. Overall, the difficulties appear minor and not consistent with dementia. I would like to refer you to a sleep medicine doctor given your history of sleep apnea that you are not currently treating.  I will be in touch when I have your results. I will see you back in clinic in 6 months. Please let me know if you have any questions or concerns in the meantime.   The physicians and staff at [facility] are committed to providing excellent care. You may receive a survey requesting feedback about your experience at our office. We strive to receive "very good" responses to the survey questions. If you feel that your experience would prevent you from giving the office a "very good " response, please contact our office to try to remedy the situation. We may be reached at [phone]. Thank you for taking the time out of your busy day to complete the survey.  Jacquelyne Balint, MD [facility]  Preventing Falls at [facility] are common, often dreaded events in the lives of older people. Aside from the obvious injuries and even death that may result, fall can cause wide-ranging consequences including loss of independence, mental decline, decreased activity and mobility. Younger people are also at risk of falling, especially those with chronic illnesses and fatigue.  Ways to reduce risk for falling Examine diet and medications. Warm foods and alcohol dilate blood vessels, which can lead to dizziness when standing. Sleep aids, antidepressants and pain medications can also increase the likelihood of a fall.  Get a vision exam. Poor vision, cataracts and glaucoma increase the chances of falling.  Check foot gear. Shoes should fit snugly and have a sturdy, nonskid sole and a broad, low heel  Participate in a physician-approved exercise program  to build and maintain muscle strength and improve balance and coordination. Programs that use ankle weights or stretch bands are excellent for muscle-strengthening. Water aerobics programs and low-impact Tai Chi programs have also been shown to improve balance and coordination.  Increase vitamin D intake. Vitamin D improves muscle strength and increases the amount of calcium the body is able to absorb and deposit in bones.  How to prevent falls from common hazards Floors - Remove all loose wires, cords, and throw rugs. Minimize clutter. Make sure rugs are anchored and smooth. Keep furniture in its usual place.  Chairs -- Use chairs with straight backs, armrests and firm seats. Add firm cushions to existing pieces to add height.  Bathroom - Install grab bars and non-skid tape in the tub or shower. Use a bathtub transfer bench or a shower chair with a back support Use an elevated toilet seat and/or safety rails to assist standing from a low surface. Do not use towel racks or bathroom tissue holders to help you stand.  Lighting - Make sure halls, stairways, and entrances are well-lit. Install a night light in your bathroom or hallway. Make sure there is a light switch at the top and bottom of the staircase. Turn lights on if you get up in the middle of the night. Make sure lamps or light switches are within reach of the bed if you have to get up during the night.  Kitchen - Install non-skid rubber mats near the sink and stove. Clean spills immediately. Store frequently used utensils, pots, pans between waist  and eye level. This helps prevent reaching and bending. Sit when getting things out of lower cupboards.  Living room/ Bedrooms - Place furniture with wide spaces in between, giving enough room to move around. Establish a route through the living room that gives you something to hold onto as you walk.  Stairs - Make sure treads, rails, and rugs are secure. Install a rail on both sides of the stairs.  If stairs are a threat, it might be helpful to arrange most of your activities on the lower level to reduce the number of times you must climb the stairs.  Entrances and doorways - Install metal handles on the walls adjacent to the doorknobs of all doors to make it more secure as you travel through the doorway.  Tips for maintaining balance Keep at least one hand free at all times. Try using a backpack or fanny pack to hold things rather than carrying them in your hands. Never carry objects in both hands when walking as this interferes with keeping your balance.  Attempt to swing both arms from front to back while walking. This might require a conscious effort if Parkinson's disease has diminished your movement. It will, however, help you to maintain balance and posture, and reduce fatigue.  Consciously lift your feet off of the ground when walking. Shuffling and dragging of the feet is a common culprit in losing your balance.  When trying to navigate turns, use a "U" technique of facing forward and making a wide turn, rather than pivoting sharply.  Try to stand with your feet shoulder-length apart. When your feet are close together for any length of time, you increase your risk of losing your balance and falling.  Do one thing at a time. Don't try to walk and accomplish another task, such as reading or looking around. The decrease in your automatic reflexes complicates motor function, so the less distraction, the better.  Do not wear rubber or gripping soled shoes, they might "catch" on the floor and cause tripping.  Move slowly when changing positions. Use deliberate, concentrated movements and, if needed, use a grab bar or walking aid. Count 15 seconds between each movement. For example, when rising from a seated position, wait 15 seconds after standing to begin walking.  If balance is a continuous problem, you might want to consider a walking aid such as a cane, walking stick, or walker.  Once you've mastered walking with help, you might be ready to try it on your own again.

## 2023-07-08 ENCOUNTER — Encounter: Payer: Self-pay | Admitting: Neurology

## 2023-07-08 LAB — IMMUNOFIXATION ELECTROPHORESIS
IgG (Immunoglobin G), Serum: 759 mg/dL (ref 600–1540)
IgM, Serum: 41 mg/dL — ABNORMAL LOW (ref 50–300)
Immunoglobulin A: 413 mg/dL — ABNORMAL HIGH (ref 70–320)

## 2023-07-08 LAB — VITAMIN B1: Vitamin B1 (Thiamine): 63 nmol/L — ABNORMAL HIGH (ref 8–30)

## 2023-07-08 LAB — HEMOGLOBIN A1C
Hgb A1c MFr Bld: 5.5 %{Hb} (ref <5.7)
Mean Plasma Glucose: 111 mg/dL
eAG (mmol/L): 6.2 mmol/L

## 2023-07-29 DIAGNOSIS — T148XXA Other injury of unspecified body region, initial encounter: Secondary | ICD-10-CM | POA: Diagnosis not present

## 2023-07-29 DIAGNOSIS — D0439 Carcinoma in situ of skin of other parts of face: Secondary | ICD-10-CM | POA: Diagnosis not present

## 2023-07-29 DIAGNOSIS — L570 Actinic keratosis: Secondary | ICD-10-CM | POA: Diagnosis not present

## 2023-07-29 DIAGNOSIS — X32XXXD Exposure to sunlight, subsequent encounter: Secondary | ICD-10-CM | POA: Diagnosis not present

## 2023-09-02 ENCOUNTER — Ambulatory Visit: Payer: PPO | Admitting: Internal Medicine

## 2023-09-02 DIAGNOSIS — J029 Acute pharyngitis, unspecified: Secondary | ICD-10-CM | POA: Diagnosis not present

## 2023-09-02 DIAGNOSIS — Z20822 Contact with and (suspected) exposure to covid-19: Secondary | ICD-10-CM | POA: Diagnosis not present

## 2023-09-09 DIAGNOSIS — E041 Nontoxic single thyroid nodule: Secondary | ICD-10-CM | POA: Diagnosis not present

## 2023-09-16 DIAGNOSIS — E041 Nontoxic single thyroid nodule: Secondary | ICD-10-CM | POA: Diagnosis not present

## 2023-09-20 ENCOUNTER — Other Ambulatory Visit: Payer: Self-pay | Admitting: Nurse Practitioner

## 2023-09-20 DIAGNOSIS — E041 Nontoxic single thyroid nodule: Secondary | ICD-10-CM

## 2023-09-30 ENCOUNTER — Ambulatory Visit
Admission: RE | Admit: 2023-09-30 | Discharge: 2023-09-30 | Disposition: A | Discharge status: Home / Self Care | Source: Ambulatory Visit | Attending: Nurse Practitioner | Admitting: Nurse Practitioner

## 2023-09-30 DIAGNOSIS — E041 Nontoxic single thyroid nodule: Secondary | ICD-10-CM

## 2023-09-30 DIAGNOSIS — E042 Nontoxic multinodular goiter: Secondary | ICD-10-CM | POA: Diagnosis not present

## 2023-10-07 DIAGNOSIS — R350 Frequency of micturition: Secondary | ICD-10-CM | POA: Diagnosis not present

## 2023-10-07 DIAGNOSIS — E876 Hypokalemia: Secondary | ICD-10-CM | POA: Diagnosis not present

## 2023-10-10 DIAGNOSIS — L570 Actinic keratosis: Secondary | ICD-10-CM | POA: Diagnosis not present

## 2023-10-10 DIAGNOSIS — Z08 Encounter for follow-up examination after completed treatment for malignant neoplasm: Secondary | ICD-10-CM | POA: Diagnosis not present

## 2023-10-10 DIAGNOSIS — Z85828 Personal history of other malignant neoplasm of skin: Secondary | ICD-10-CM | POA: Diagnosis not present

## 2023-10-10 DIAGNOSIS — L980 Pyogenic granuloma: Secondary | ICD-10-CM | POA: Diagnosis not present

## 2023-10-10 DIAGNOSIS — X32XXXD Exposure to sunlight, subsequent encounter: Secondary | ICD-10-CM | POA: Diagnosis not present

## 2023-10-15 DIAGNOSIS — I10 Essential (primary) hypertension: Secondary | ICD-10-CM | POA: Diagnosis not present

## 2023-11-07 DIAGNOSIS — L570 Actinic keratosis: Secondary | ICD-10-CM | POA: Diagnosis not present

## 2023-11-07 DIAGNOSIS — X32XXXD Exposure to sunlight, subsequent encounter: Secondary | ICD-10-CM | POA: Diagnosis not present

## 2023-11-07 DIAGNOSIS — C44212 Basal cell carcinoma of skin of right ear and external auricular canal: Secondary | ICD-10-CM | POA: Diagnosis not present

## 2023-12-12 NOTE — Progress Notes (Deleted)
 NEUROLOGY FOLLOW UP OFFICE NOTE  Allen Pierce [id number]  Subjective:  Allen Pierce is a 83 y.o. year old male with a history of HTN, HLD, gout, lumbar stenosis, hx of calcium  oxalate kidney stones, B12 deficiency, prior use of CPAP (likely OSA) who we last saw on 07/03/23 for neuropathy, headaches, and memory changes.  To briefly review: 12/12/22: He had a bad headache yesterday, but had not had one for a month prior. The headache started slowly behind the left ear. It slowly travelled up to the top of the head. It is an achy and squeezing, pressure sensation. He rates the headache 7-8/10. It was bad for about 1 hour then eased up. The headache was faint when he woke up and stopped later in the evening(10-12 hours). Before a month ago, he was having maybe having a headache every 5 to 7 days. He will take tylenol  as needed, which usually helps the headache. The headache yesterday, patient waited to take the tylenol . He only takes tylenol  every couple of weeks. He denies changes in vision, photophobia, phonophobia, nausea, or vomiting. He denies jaw claudication or recurrent fevers. There is no significant changes in headache with position changes. He has some neck pain during HA, but otherwise not significant.   Patient also mentions memory problems. He has noticed changes over the last 1-2 years. He notices difficulty coming up with names. 60-70% of the time, the name will come to him after 1-2 minutes. He sometimes has to ask his wife the name. Every once in a while he will walk into a room and not remember what he came into it for. He denies difficulty with driving or getting lost. He is independent of all ADLs and IADLs.   Regarding sleep, patient has to get up and use the restroom (2-7 times per night, 2-3 usually). He otherwise thinks he sleeps well. Patient does snore (per wife report). Patient tends to feel less rested when he wakes up then he used to. He does have to take a nap after  lunch each day. Patient had a sleep study in the past and was on CPAP years ago. He has not used this in over 18 years. He did not feel it was benefiting him.   Regarding his mood, he feels like he is a 6-8 on depression scale (10 being the worst). He feels like he is getting old and is concerned about health and changes he is seeing.   Caffeine use: 1 cup of coffee per day; drinking more soda over the last 6 months (about 1 12 ounce bottle per day), iced tea for lunch Patient is a non-smoker EtOH use: none   Of note, patient is on B12 1000 mcg daily, B complex daily, iron, and metoprolol .   Patient was seen by Dr. Margaret at [facility] for numbness and tingling in his toes in 2019. 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 Patient has ongoing symptoms with some imbalance, but has been stable.  07/03/23: TSH and B12 were normal. Patient did not schedule sleep study or sleep medicine referral. Patient did not pursue this.    Patient thinks his symptoms are about the same. His headaches has not been bad. He may get 1 headache per week. They occur either at bedtime or in the morning. His memory may be a little worse. He tends to forget names. He has no difficulty taking care of himself and is still driving and not get lost.  He thinks his sleep is okay. He has to wake 3-6 times a night to use the restroom. He thinks he sleeps 8 hours, but it is broken up. He does feel well rested, but feels tired in the afternoon and will nap if he can.   He denies any recent mood issues, not down or depressed.  Most recent Assessment and Plan (07/03/23): This is Allen Pierce, a 83 y.o. male with: Cognitive change, particularly with memory. Independent of ADLs, no red flag symptoms. CTH in 2023 showed mild atrophy, nothing significantly abnormal for age. TSH and B12 were normal. MoCA on 12/12/22 was 21/30. He may have some mild cognitive impairment, but no obvious signs of dementia. Symptoms  could also be related to poor sleep and untreated OSA. He has not seen sleep medicine as we discussed at last visit. He is agreeable to seeing sleep medicine now. 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 I will check labs for other treatable causes. Headaches - improved, now only minor, once a week   Plan: -Blood work: B1, HbA1c, IFE -Sleep medicine referral -Fall precautions discussed  Since their last visit: *** Did he see sleep?***  MEDICATIONS:  Outpatient Encounter Medications as of 12/20/2023  Medication Sig   allopurinol  (ZYLOPRIM ) 300 MG tablet Take 1 tablet by mouth daily.   aspirin 81 MG chewable tablet 1 tablet   B-Complex TABS See admin instructions.   Cholecalciferol (VITAMIN D3) 50 MCG (2000 UT) capsule 1 capsule   clotrimazole (LOTRIMIN) 1 % cream 1 application   Cyanocobalamin  (VITAMIN B 12 PO) Take by mouth. 1000 mcg daily   furosemide (LASIX) 20 MG tablet Take 20 mg by mouth 2 (two) times daily.   gabapentin  (NEURONTIN ) 300 MG capsule TAKE 1 CAPSULE BY MOUTH twicea day   ibuprofen (ADVIL) 200 MG tablet Take 200 mg by mouth every 6 (six) hours as needed. Takes  400 mg prn   metoprolol  (LOPRESSOR ) 50 MG tablet Take 25 mg by mouth 2 (two) times daily.    Multiple Vitamins-Minerals (AIRBORNE) CHEW See admin instructions.   OVER THE COUNTER MEDICATION Vitamin d 3 25 mg daily   potassium chloride (KLOR-CON) 10 MEQ tablet Take 10 mEq by mouth every other day.   rosuvastatin  (CRESTOR ) 10 MG tablet Take 20 mg by mouth every other day. At qhs   tamsulosin (FLOMAX) 0.4 MG CAPS capsule 1 capsule 30 minutes after the same meal each day Orally Once a day AN HOUR AFTER SUPPER for 90 days   traMADol (ULTRAM) 50 MG tablet Take 50 mg by mouth every 6 (six) hours as needed.   Zinc  50 MG TABS  (Patient not taking: Reported on 07/03/2023)   No facility-administered encounter medications on file as of 12/20/2023.    PAST MEDICAL HISTORY: Past Medical  History:  Diagnosis Date   A-fib [facility])    no cardiologist occ heard by dr pharr   AP (abdominal pain)    Back pain    bad disc   Dysrhythmia    occ followed by dr clarice   GERD (gastroesophageal reflux disease)    Hemorrhoids    History of gout    History of hiatal hernia    History of kidney stones    HLD (hyperlipidemia)    HTN (hypertension)    Inguinal hernia    Melanoma (HCC) 2017, 2021   skin cancer ears x 2, early skin cancer removed from forehead dr hall Mar 24 2020   Rash  03/2020   buttock area using cream healing    Right inguinal hernia    Sleep apnea    discontinued cpap 2011 due to no longer needed per md    PAST SURGICAL HISTORY: Past Surgical History:  Procedure Laterality Date   COLONOSCOPY     x 2 along with endoscopy each time    DIAGNOSTIC LAPAROSCOPY  2018   HERNIA REPAIR     Right   INGUINAL HERNIA REPAIR Right 08/17/2016   Procedure: LAPAROSCOPIC RIGHT INGUINAL HERNIA REPAIR;  Surgeon: Mitzie DELENA Freund, MD;  Location: [facility];  Service: General;  Laterality: Right;   INGUINAL HERNIA REPAIR Right 01/25/2017   Procedure: RIGHT INGUINAL HERNIA REPAIR WITH MESH;  Surgeon: Freund Mitzie DELENA, MD;  Location: [facility];  Service: General;  Laterality: Right;   INGUINAL HERNIA REPAIR Left 04/13/2020   Procedure: OPEN LEFT INGUINAL HERNIA REPAIR WITH MESH;  Surgeon: Freund Mitzie DELENA, MD;  Location: [facility] [place];  Service: General;  Laterality: Left;   INSERTION OF MESH Right 01/25/2017   Procedure: INSERTION OF MESH;  Surgeon: Freund Mitzie DELENA, MD;  Location: [facility];  Service: General;  Laterality: Right;   LAPAROSCOPIC REMOVAL OF MESENTERIC MASS N/A 08/17/2016   Procedure: BIOPSY OF MESENTERIC MASS;  Surgeon: Mitzie DELENA Freund, MD;  Location: [facility];  Service: General;  Laterality: N/A;   ORIF WRIST FRACTURE Left 06/12/2022   Procedure: OPEN REDUCTION INTERNAL FIXATION (ORIF) WRIST FRACTURE;  Surgeon: Alyse Agent, MD;  Location: [facility];   Service: Orthopedics;  Laterality: Left;  regional with MAC    ALLERGIES: Allergies  Allergen Reactions   Lisinopril Cough    FAMILY HISTORY: Family History  Problem Relation Age of Onset   Hypertension Mother    CVA Father    Hypertension Father    Heart attack Father    Diabetes Brother    Hypertension Brother    Colon polyps Brother    Melanoma Brother    Stroke Brother    Cancer Brother        kidney    SOCIAL HISTORY: Social History   Tobacco Use   Smoking status: Former    Types: Cigarettes   Smokeless tobacco: Never   Tobacco comments:    1-2 years in high school  Vaping Use   Vaping status: Never Used  Substance Use Topics   Alcohol use: Never   Drug use: Never   Social History   Social History Narrative   Lives with wife   Caffeine- coffee 1 cup daily   Are you right handed or left handed? left   Are you currently employed ?    What is your current occupation?retired   Do you live at home alone?   Who lives with you? wife   What type of home do you live in: 1 story or 2 story? one          Objective:  Vital Signs:  There were no vitals taken for this visit.  ***  Labs and Imaging review: New results: 07/03/23: B1: elevated to 63 HbA1c: 5.5 IFE: no M protein  Previously reviewed results: 12/12/22: B12: 625 TSH wnl        Lab Results  Component Value Date    HGBA1C 6.0 (H) 03/25/2018      Recent Labs           Lab Results  Component Value Date    VITAMINB12 182 (L) 03/25/2018  Recent Labs[] Expand by Default           Lab Results  Component Value Date    TSH 1.580 03/25/2018      BMP (06/03/22): significant for glucose of 114, Cr 1.90   MM panel (03/25/18): no M protein   External labs: TSH (02/26/22): 1.89 B12 (02/26/22): 646   Imaging: CT head and cervical spine wo contrast (06/03/22): FINDINGS: CT HEAD FINDINGS   Brain: No evidence of acute infarction, hemorrhage, hydrocephalus, extra-axial collection  or mass lesion/mass effect. Chronic white matter ischemic changes are noted.   Vascular: No hyperdense vessel or unexpected calcification.   Skull: Normal. Negative for fracture or focal lesion.   Sinuses/Orbits: No acute finding.   Other: None.   CT CERVICAL SPINE FINDINGS   Alignment: Within normal limits.   Skull base and vertebrae: 7 cervical segments are well visualized. Vertebral body height is well maintained. Multilevel osteophytic change and facet hypertrophic changes are noted. No acute fracture or acute facet abnormality is noted. The odontoid is normal limits.   Soft tissues and spinal canal: Surrounding soft tissue structures demonstrate diffuse vascular calcifications. There is a 2.1 cm hypodense nodule arising from the lower portion of the right lobe of thyroid . Some heterogeneity of the left lobe of the thyroid  is noted as well.   Upper chest: Visualized lung apices are within normal limits.   Other: None   IMPRESSION: CT of the head: Chronic white matter ischemic changes. No acute abnormality noted.   CT of the cervical spine: Multilevel degenerative changes without acute fracture.  Assessment/Plan:  This is Allen Pierce, a 83 y.o. male with: ***   Plan: ***  Return to clinic in ***  Total time spent reviewing records, interview, history/exam, documentation, and coordination of care on day of encounter:  *** min  Venetia Potters, MD

## 2023-12-20 ENCOUNTER — Ambulatory Visit: Payer: PPO | Admitting: Neurology

## 2024-01-02 DIAGNOSIS — X32XXXD Exposure to sunlight, subsequent encounter: Secondary | ICD-10-CM | POA: Diagnosis not present

## 2024-01-02 DIAGNOSIS — L570 Actinic keratosis: Secondary | ICD-10-CM | POA: Diagnosis not present

## 2024-01-02 DIAGNOSIS — Z85828 Personal history of other malignant neoplasm of skin: Secondary | ICD-10-CM | POA: Diagnosis not present

## 2024-01-02 DIAGNOSIS — Z08 Encounter for follow-up examination after completed treatment for malignant neoplasm: Secondary | ICD-10-CM | POA: Diagnosis not present

## 2024-02-06 DIAGNOSIS — I10 Essential (primary) hypertension: Secondary | ICD-10-CM | POA: Diagnosis not present

## 2024-02-06 DIAGNOSIS — E782 Mixed hyperlipidemia: Secondary | ICD-10-CM | POA: Diagnosis not present

## 2024-02-06 DIAGNOSIS — R7303 Prediabetes: Secondary | ICD-10-CM | POA: Diagnosis not present

## 2024-02-13 DIAGNOSIS — I2510 Atherosclerotic heart disease of native coronary artery without angina pectoris: Secondary | ICD-10-CM | POA: Diagnosis not present

## 2024-02-13 DIAGNOSIS — M4807 Spinal stenosis, lumbosacral region: Secondary | ICD-10-CM | POA: Diagnosis not present

## 2024-02-13 DIAGNOSIS — J358 Other chronic diseases of tonsils and adenoids: Secondary | ICD-10-CM | POA: Diagnosis not present

## 2024-02-13 DIAGNOSIS — R7303 Prediabetes: Secondary | ICD-10-CM | POA: Diagnosis not present

## 2024-02-13 DIAGNOSIS — K219 Gastro-esophageal reflux disease without esophagitis: Secondary | ICD-10-CM | POA: Diagnosis not present

## 2024-02-13 DIAGNOSIS — E782 Mixed hyperlipidemia: Secondary | ICD-10-CM | POA: Diagnosis not present

## 2024-02-13 DIAGNOSIS — G629 Polyneuropathy, unspecified: Secondary | ICD-10-CM | POA: Diagnosis not present

## 2024-02-13 DIAGNOSIS — I10 Essential (primary) hypertension: Secondary | ICD-10-CM | POA: Diagnosis not present

## 2024-02-13 DIAGNOSIS — Z1212 Encounter for screening for malignant neoplasm of rectum: Secondary | ICD-10-CM | POA: Diagnosis not present

## 2024-02-13 DIAGNOSIS — Z23 Encounter for immunization: Secondary | ICD-10-CM | POA: Diagnosis not present

## 2024-02-13 DIAGNOSIS — D890 Polyclonal hypergammaglobulinemia: Secondary | ICD-10-CM | POA: Diagnosis not present

## 2024-02-13 DIAGNOSIS — Z0000 Encounter for general adult medical examination without abnormal findings: Secondary | ICD-10-CM | POA: Diagnosis not present

## 2024-02-13 DIAGNOSIS — M109 Gout, unspecified: Secondary | ICD-10-CM | POA: Diagnosis not present

## 2024-02-14 ENCOUNTER — Encounter (INDEPENDENT_AMBULATORY_CARE_PROVIDER_SITE_OTHER): Payer: Self-pay

## 2024-02-27 DIAGNOSIS — L570 Actinic keratosis: Secondary | ICD-10-CM | POA: Diagnosis not present

## 2024-02-27 DIAGNOSIS — D044 Carcinoma in situ of skin of scalp and neck: Secondary | ICD-10-CM | POA: Diagnosis not present

## 2024-02-27 DIAGNOSIS — X32XXXD Exposure to sunlight, subsequent encounter: Secondary | ICD-10-CM | POA: Diagnosis not present

## 2024-04-09 ENCOUNTER — Encounter (INDEPENDENT_AMBULATORY_CARE_PROVIDER_SITE_OTHER): Payer: Self-pay

## 2024-04-09 ENCOUNTER — Ambulatory Visit (INDEPENDENT_AMBULATORY_CARE_PROVIDER_SITE_OTHER)

## 2024-04-09 VITALS — BP 147/83 | HR 59

## 2024-04-09 DIAGNOSIS — K219 Gastro-esophageal reflux disease without esophagitis: Secondary | ICD-10-CM | POA: Diagnosis not present

## 2024-04-09 DIAGNOSIS — R053 Chronic cough: Secondary | ICD-10-CM | POA: Diagnosis not present

## 2024-04-09 DIAGNOSIS — J383 Other diseases of vocal cords: Secondary | ICD-10-CM | POA: Diagnosis not present

## 2024-04-09 DIAGNOSIS — R490 Dysphonia: Secondary | ICD-10-CM

## 2024-04-09 NOTE — Progress Notes (Signed)
 Dear Dr. Clarice, Here is my assessment for our mutual patient, Allen Pierce. Thank you for allowing me the opportunity to care for your patient. Please do not hesitate to contact me should you have any other questions. Sincerely, Dr. Hadassah Parody  Otolaryngology Clinic Note Referring provider: Dr. Clarice HPI:   Initial HPI (04/09/2024) Discussed the use of AI scribe software for clinical note transcription with the patient, who gave verbal consent to proceed.  History of Present Illness Allen Pierce is an 83 year old male who presents with a chronic cough, particularly at night.  Cough - Chronic, 'hacky' cough predominantly at night when lying down to sleep - Most pronounced upon first going to bed, lasting approximately three minutes with intermittent spells - Rarely wakes him from sleep unless he gets up to use the bathroom, at which point brief coughing may recur - Minimal cough during the day, subsides once he is up and moving in the morning  Heartburn - Occasional episodes of heartburn - Managed with over-the-counter medication, he can't recall the exact name  - No regular use of acid reflux medication - Heartburn occurs infrequently  Voice changes - Voice has softened over the past year and has progressed somewhat, particularly since the beginning of the year - No impact on clarity or ability to speak publicly  Worked as a optician, dispensing for many years   Independent Review of Additional Tests or Records:  Referral note Ryan Clarice, MD (02/14/24): having nighttime cough, possible left tonsil stone, refer to ENT   TSH 12/12/22: 1.37  POCT SARS/Flu/RSV 09/02/23: negative   PMH/Meds/All/SocHx/FamHx/ROS:   Past Medical History:  Diagnosis Date   A-fib [facility])    no cardiologist occ heard by dr pharr   AP (abdominal pain)    Back pain    bad disc   Dysrhythmia    occ followed by dr clarice   GERD (gastroesophageal reflux disease)    Hemorrhoids    History of gout     History of hiatal hernia    History of kidney stones    HLD (hyperlipidemia)    HTN (hypertension)    Inguinal hernia    Melanoma (HCC) 2017, 2021   skin cancer ears x 2, early skin cancer removed from forehead dr hall Mar 24 2020   Rash 03/2020   buttock area using cream healing    Right inguinal hernia    Sleep apnea    discontinued cpap 2011 due to no longer needed per md     Past Surgical History:  Procedure Laterality Date   COLONOSCOPY     x 2 along with endoscopy each time    DIAGNOSTIC LAPAROSCOPY  2018   HERNIA REPAIR     Right   INGUINAL HERNIA REPAIR Right 08/17/2016   Procedure: LAPAROSCOPIC RIGHT INGUINAL HERNIA REPAIR;  Surgeon: Mitzie DELENA Freund, MD;  Location: [facility];  Service: General;  Laterality: Right;   INGUINAL HERNIA REPAIR Right 01/25/2017   Procedure: RIGHT INGUINAL HERNIA REPAIR WITH MESH;  Surgeon: Freund Mitzie DELENA, MD;  Location: [facility];  Service: General;  Laterality: Right;   INGUINAL HERNIA REPAIR Left 04/13/2020   Procedure: OPEN LEFT INGUINAL HERNIA REPAIR WITH MESH;  Surgeon: Freund Mitzie DELENA, MD;  Location: [facility];  Service: General;  Laterality: Left;   INSERTION OF MESH Right 01/25/2017   Procedure: INSERTION OF MESH;  Surgeon: Freund Mitzie DELENA, MD;  Location: [facility];  Service: General;  Laterality: Right;   LAPAROSCOPIC  REMOVAL OF MESENTERIC MASS N/A 08/17/2016   Procedure: BIOPSY OF MESENTERIC MASS;  Surgeon: Mitzie DELENA Freund, MD;  Location: [facility];  Service: General;  Laterality: N/A;   ORIF WRIST FRACTURE Left 06/12/2022   Procedure: OPEN REDUCTION INTERNAL FIXATION (ORIF) WRIST FRACTURE;  Surgeon: Alyse Agent, MD;  Location: [facility];  Service: Orthopedics;  Laterality: Left;  regional with MAC    Family History  Problem Relation Age of Onset   Hypertension Mother    CVA Father    Hypertension Father    Heart attack Father    Diabetes Brother    Hypertension Brother    Colon polyps Brother     Melanoma Brother    Stroke Brother    Cancer Brother        kidney     Social Connections: Not on file     Current Outpatient Medications  Medication Instructions   allopurinol  (ZYLOPRIM ) 300 MG tablet 1 tablet, Daily   aspirin 81 MG chewable tablet 1 tablet   B-Complex TABS See admin instructions   Cholecalciferol (VITAMIN D3) 50 MCG (2000 UT) capsule 1 capsule   clotrimazole (LOTRIMIN) 1 % cream 1 application   Cyanocobalamin  (VITAMIN B 12 PO) Take by mouth. 1000 mcg daily   furosemide (LASIX) 20 mg, 2 times daily   gabapentin  (NEURONTIN ) 300 MG capsule TAKE 1 CAPSULE BY MOUTH twicea day   ibuprofen (ADVIL) 200 mg, Every 6 hours PRN   metoprolol  tartrate (LOPRESSOR ) 25 mg, 2 times daily   Multiple Vitamins-Minerals (AIRBORNE) CHEW See admin instructions   OVER THE COUNTER MEDICATION Vitamin d 3 25 mg daily   potassium chloride (KLOR-CON) 10 MEQ tablet 10 mEq, Every other day   rosuvastatin  (CRESTOR ) 20 mg, Every other day   tamsulosin (FLOMAX) 0.4 MG CAPS capsule 1 capsule 30 minutes after the same meal each day Orally Once a day AN HOUR AFTER SUPPER for 90 days   traMADol (ULTRAM) 50 mg, Every 6 hours PRN   Zinc  50 MG TABS      Physical Exam:   BP (!) 147/83 Comment: coughing fit  Pulse (!) 59   SpO2 98%   Salient findings:  CN II-XII intact Bilateral EAC clear and TM intact with well pneumatized middle ear spaces Anterior rhinoscopy: Septum midline anteriorly; bilateral inferior turbinates with mild hypertrophy No lesions of oral cavity/oropharynx No obviously palpable neck masses/lymphadenopathy/thyromegaly No respiratory distress or stridor TFL was indicated to better evaluate the proximal airway, given the patient's history and exam findings, and is detailed below.  Seprately Identifiable Procedures:  Prior to initiating any procedures, risks/benefits/alternatives were explained to the patient and verbal consent obtained.  Procedure Note  (04/09/2024) Pre-procedure diagnosis:  Dysphonia, chronic cough  Post-procedure diagnosis: Same Procedure: Transnasal Fiberoptic Laryngoscopy, CPT 31575 - Mod 25 Indication: Dysphonia, chronic cough  Complications: None apparent EBL: 0 mL  The procedure was undertaken to further evaluate the patient's complaint of Dysphonia, chronic cough with mirror exam inadequate for appropriate examination due to gag reflex and poor patient tolerance  Procedure:  Patient was identified as correct patient. Verbal consent was obtained. The nose was sprayed with oxymetazoline and 4% lidocaine . The The flexible laryngoscope was passed through the nose to view the nasal cavity, pharynx (oropharynx, hypopharynx) and larynx.  The larynx was examined at rest and during multiple phonatory tasks. Documentation was obtained and reviewed with patient. The scope was removed. The patient tolerated the procedure well.  Findings: The nasal cavity and nasopharynx did  not reveal any masses or lesions, mucosa appeared to be without obvious lesions. The tongue base, pharyngeal walls, piriform sinuses, vallecula, epiglottis and postcricoid region are normal in appearance EXCEPT: bilateral age related vocal fold atrophy . The visualized portion of the subglottis and proximal trachea is widely patent. The vocal folds are mobile bilaterally. There are no lesions on the free edge of the vocal folds nor elsewhere in the larynx worrisome for malignancy.    Electronically signed by: Hadassah JAYSON Parody, MD 04/12/2024 8:13 PM   Impression & Plans:  Alvey Brockel is a 83 y.o. male with   1. Gastroesophageal reflux disease, unspecified whether esophagitis present   2. 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 No significant  laryngoscopy findings. Symptoms and particularly his history of cough only when he lies down at night suggest GERD. - Recommended Reflux Gourmet after dinner to prevent acid reflux. - Advised consultation with primary care physician for potential acid reflux medication if symptoms persist.  Age-related vocal cord thinning Mild age-related vocal cord thinning causing softer voice. No significant impact on speech clarity or function. - Discussed potential procedures for vocal cord augmentation if interested in voice enhancement. He is not interested currently.    See below regarding exact medications prescribed this encounter including dosages and route: No orders of the defined types were placed in this encounter.   Thank you for allowing me the opportunity to care for your patient. Please do not hesitate to contact me should you have any other questions.  Sincerely, Hadassah Parody, MD Otolaryngologist (ENT), [facility] Phone: [phone] Fax: [phone]

## 2024-05-14 DIAGNOSIS — M25562 Pain in left knee: Secondary | ICD-10-CM | POA: Diagnosis not present

## 2024-05-14 DIAGNOSIS — M1712 Unilateral primary osteoarthritis, left knee: Secondary | ICD-10-CM | POA: Diagnosis not present

## 2024-05-14 DIAGNOSIS — W19XXXA Unspecified fall, initial encounter: Secondary | ICD-10-CM | POA: Diagnosis not present

## 2024-05-14 DIAGNOSIS — M25462 Effusion, left knee: Secondary | ICD-10-CM | POA: Diagnosis not present

## 2024-05-14 DIAGNOSIS — S0081XA Abrasion of other part of head, initial encounter: Secondary | ICD-10-CM | POA: Diagnosis not present
